# Patient Record
Sex: Female | Born: 1975 | Race: White | Hispanic: No | Marital: Married | Smoking: Former smoker
Health system: Southern US, Community
[De-identification: ages and names within clinical notes are randomized; demographics above are authoritative.]

## PROBLEM LIST (undated history)

## (undated) DIAGNOSIS — O139 Gestational [pregnancy-induced] hypertension without significant proteinuria, unspecified trimester: Secondary | ICD-10-CM

## (undated) DIAGNOSIS — E785 Hyperlipidemia, unspecified: Secondary | ICD-10-CM

## (undated) DIAGNOSIS — G8929 Other chronic pain: Secondary | ICD-10-CM

## (undated) DIAGNOSIS — F418 Other specified anxiety disorders: Secondary | ICD-10-CM

## (undated) DIAGNOSIS — E669 Obesity, unspecified: Secondary | ICD-10-CM

## (undated) DIAGNOSIS — M1990 Unspecified osteoarthritis, unspecified site: Secondary | ICD-10-CM

## (undated) DIAGNOSIS — G4730 Sleep apnea, unspecified: Secondary | ICD-10-CM

## (undated) DIAGNOSIS — F329 Major depressive disorder, single episode, unspecified: Secondary | ICD-10-CM

## (undated) DIAGNOSIS — IMO0002 Reserved for concepts with insufficient information to code with codable children: Secondary | ICD-10-CM

## (undated) DIAGNOSIS — F32A Depression, unspecified: Secondary | ICD-10-CM

## (undated) DIAGNOSIS — O99345 Other mental disorders complicating the puerperium: Secondary | ICD-10-CM

## (undated) DIAGNOSIS — F99 Mental disorder, not otherwise specified: Secondary | ICD-10-CM

## (undated) DIAGNOSIS — B999 Unspecified infectious disease: Secondary | ICD-10-CM

## (undated) DIAGNOSIS — O24419 Gestational diabetes mellitus in pregnancy, unspecified control: Secondary | ICD-10-CM

## (undated) HISTORY — DX: Other mental disorders complicating the puerperium: O99.345

## (undated) HISTORY — PX: OTHER SURGICAL HISTORY: SHX169

## (undated) HISTORY — DX: Other chronic pain: G89.29

## (undated) HISTORY — DX: Unspecified osteoarthritis, unspecified site: M19.90

## (undated) HISTORY — PX: DILATION AND CURETTAGE OF UTERUS: SHX78

## (undated) HISTORY — DX: Hyperlipidemia, unspecified: E78.5

## (undated) HISTORY — DX: Obesity, unspecified: E66.9

## (undated) HISTORY — DX: Other specified anxiety disorders: F41.8

## (undated) SURGERY — Surgical Case
Anesthesia: *Unknown

---

## 2000-10-31 ENCOUNTER — Other Ambulatory Visit: Admission: RE | Admit: 2000-10-31 | Discharge: 2000-10-31 | Payer: Self-pay | Admitting: *Deleted

## 2000-11-02 ENCOUNTER — Other Ambulatory Visit: Admission: RE | Admit: 2000-11-02 | Discharge: 2000-11-02 | Payer: Self-pay | Admitting: *Deleted

## 2001-11-26 ENCOUNTER — Other Ambulatory Visit: Admission: RE | Admit: 2001-11-26 | Discharge: 2001-11-26 | Payer: Self-pay | Admitting: Obstetrics and Gynecology

## 2002-10-06 DIAGNOSIS — IMO0002 Reserved for concepts with insufficient information to code with codable children: Secondary | ICD-10-CM

## 2002-10-06 HISTORY — DX: Reserved for concepts with insufficient information to code with codable children: IMO0002

## 2002-12-12 ENCOUNTER — Other Ambulatory Visit: Admission: RE | Admit: 2002-12-12 | Discharge: 2002-12-12 | Payer: Self-pay | Admitting: *Deleted

## 2002-12-12 ENCOUNTER — Other Ambulatory Visit: Admission: RE | Admit: 2002-12-12 | Discharge: 2002-12-12 | Payer: Self-pay | Admitting: Obstetrics and Gynecology

## 2006-02-16 ENCOUNTER — Inpatient Hospital Stay (HOSPITAL_COMMUNITY): Admission: RE | Admit: 2006-02-16 | Discharge: 2006-02-27 | Payer: Self-pay | Admitting: Psychiatry

## 2006-02-17 ENCOUNTER — Ambulatory Visit: Payer: Self-pay | Admitting: Psychiatry

## 2012-02-06 ENCOUNTER — Other Ambulatory Visit (HOSPITAL_COMMUNITY): Payer: Self-pay | Admitting: Obstetrics

## 2012-02-06 DIAGNOSIS — N979 Female infertility, unspecified: Secondary | ICD-10-CM

## 2012-02-13 ENCOUNTER — Ambulatory Visit (HOSPITAL_COMMUNITY)
Admission: RE | Admit: 2012-02-13 | Discharge: 2012-02-13 | Disposition: A | Discharge status: Home / Self Care | Payer: Managed Care, Other (non HMO) | Source: Ambulatory Visit | Attending: Obstetrics | Admitting: Obstetrics

## 2012-02-13 DIAGNOSIS — N979 Female infertility, unspecified: Secondary | ICD-10-CM | POA: Insufficient documentation

## 2012-02-13 MED ORDER — IOHEXOL 300 MG/ML  SOLN
7.0000 mL | Freq: Once | INTRAMUSCULAR | Status: AC | PRN
  Administered 2012-02-13: 7 mL

## 2012-07-26 ENCOUNTER — Encounter (HOSPITAL_COMMUNITY): Payer: Self-pay | Admitting: *Deleted

## 2012-07-26 ENCOUNTER — Other Ambulatory Visit: Payer: Self-pay | Admitting: Obstetrics

## 2012-07-27 ENCOUNTER — Encounter (HOSPITAL_COMMUNITY): Payer: Self-pay | Admitting: Anesthesiology

## 2012-07-27 ENCOUNTER — Ambulatory Visit (HOSPITAL_COMMUNITY)
Admission: AD | Admit: 2012-07-27 | Discharge: 2012-07-27 | Disposition: A | Discharge status: Home / Self Care | Payer: BC Managed Care – PPO | Source: Ambulatory Visit | Attending: Obstetrics | Admitting: Obstetrics

## 2012-07-27 ENCOUNTER — Encounter (HOSPITAL_COMMUNITY): Payer: Self-pay | Admitting: *Deleted

## 2012-07-27 ENCOUNTER — Encounter (HOSPITAL_COMMUNITY): Payer: Self-pay | Admitting: Pharmacist

## 2012-07-27 ENCOUNTER — Ambulatory Visit (HOSPITAL_COMMUNITY): Payer: BC Managed Care – PPO | Admitting: Anesthesiology

## 2012-07-27 ENCOUNTER — Ambulatory Visit (HOSPITAL_COMMUNITY): Payer: BC Managed Care – PPO

## 2012-07-27 ENCOUNTER — Encounter (HOSPITAL_COMMUNITY)
Admission: AD | Disposition: A | Discharge status: Home / Self Care | Payer: Self-pay | Source: Ambulatory Visit | Attending: Obstetrics

## 2012-07-27 DIAGNOSIS — O021 Missed abortion: Secondary | ICD-10-CM

## 2012-07-27 HISTORY — DX: Mental disorder, not otherwise specified: F99

## 2012-07-27 HISTORY — PX: DILATION AND EVACUATION: SHX1459

## 2012-07-27 HISTORY — DX: Sleep apnea, unspecified: G47.30

## 2012-07-27 LAB — CBC
MCH: 31.4 pg (ref 26.0–34.0)
MCHC: 34.3 g/dL (ref 30.0–36.0)
Platelets: 268 10*3/uL (ref 150–400)

## 2012-07-27 SURGERY — DILATION AND EVACUATION, UTERUS
Anesthesia: General | Site: Uterus | Wound class: Clean Contaminated

## 2012-07-27 MED ORDER — FENTANYL CITRATE 0.05 MG/ML IJ SOLN
INTRAMUSCULAR | Status: AC
  Filled 2012-07-27: qty 2

## 2012-07-27 MED ORDER — LIDOCAINE HCL (CARDIAC) 20 MG/ML IV SOLN
INTRAVENOUS | Status: AC
  Filled 2012-07-27: qty 5

## 2012-07-27 MED ORDER — LIDOCAINE HCL 1 % IJ SOLN
INTRAMUSCULAR | Status: DC | PRN
  Administered 2012-07-27: 20 mL

## 2012-07-27 MED ORDER — DOXYCYCLINE HYCLATE 100 MG IV SOLR
100.0000 mg | INTRAVENOUS | Status: AC
  Administered 2012-07-27: 100 mg via INTRAVENOUS
  Filled 2012-07-27: qty 100

## 2012-07-27 MED ORDER — PROPOFOL 10 MG/ML IV EMUL
INTRAVENOUS | Status: DC | PRN
  Administered 2012-07-27: 200 mg via INTRAVENOUS

## 2012-07-27 MED ORDER — DEXAMETHASONE SODIUM PHOSPHATE 10 MG/ML IJ SOLN
INTRAMUSCULAR | Status: AC
  Filled 2012-07-27: qty 1

## 2012-07-27 MED ORDER — ONDANSETRON HCL 4 MG/2ML IJ SOLN
INTRAMUSCULAR | Status: DC | PRN
  Administered 2012-07-27: 4 mg via INTRAVENOUS

## 2012-07-27 MED ORDER — FENTANYL CITRATE 0.05 MG/ML IJ SOLN: 25.0000 ug | INTRAMUSCULAR | Status: DC | PRN

## 2012-07-27 MED ORDER — LIDOCAINE HCL (CARDIAC) 20 MG/ML IV SOLN
INTRAVENOUS | Status: DC | PRN
  Administered 2012-07-27 (×2): 50 mg via INTRAVENOUS

## 2012-07-27 MED ORDER — SILVER NITRATE-POT NITRATE 75-25 % EX MISC
CUTANEOUS | Status: DC | PRN
  Administered 2012-07-27: 2

## 2012-07-27 MED ORDER — KETOROLAC TROMETHAMINE 30 MG/ML IJ SOLN
INTRAMUSCULAR | Status: AC
  Filled 2012-07-27: qty 1

## 2012-07-27 MED ORDER — DEXAMETHASONE SODIUM PHOSPHATE 4 MG/ML IJ SOLN
INTRAMUSCULAR | Status: DC | PRN
  Administered 2012-07-27: 10 mg via INTRAVENOUS

## 2012-07-27 MED ORDER — ONDANSETRON HCL 4 MG/2ML IJ SOLN
INTRAMUSCULAR | Status: AC
  Filled 2012-07-27: qty 2

## 2012-07-27 MED ORDER — LACTATED RINGERS IV SOLN
INTRAVENOUS | Status: DC
  Administered 2012-07-27 (×2): via INTRAVENOUS

## 2012-07-27 MED ORDER — PROPOFOL 10 MG/ML IV EMUL
INTRAVENOUS | Status: AC
  Filled 2012-07-27: qty 20

## 2012-07-27 MED ORDER — MIDAZOLAM HCL 2 MG/2ML IJ SOLN
INTRAMUSCULAR | Status: AC
  Filled 2012-07-27: qty 2

## 2012-07-27 MED ORDER — SILVER NITRATE-POT NITRATE 75-25 % EX MISC
CUTANEOUS | Status: AC
  Filled 2012-07-27: qty 2

## 2012-07-27 MED ORDER — FENTANYL CITRATE 0.05 MG/ML IJ SOLN
INTRAMUSCULAR | Status: DC | PRN
  Administered 2012-07-27: 100 ug via INTRAVENOUS

## 2012-07-27 MED ORDER — MIDAZOLAM HCL 5 MG/5ML IJ SOLN
INTRAMUSCULAR | Status: DC | PRN
  Administered 2012-07-27: 2 mg via INTRAVENOUS

## 2012-07-27 SURGICAL SUPPLY — 18 items
CATH ROBINSON RED A/P 16FR (CATHETERS) ×2 IMPLANT
CLOTH BEACON ORANGE TIMEOUT ST (SAFETY) ×2 IMPLANT
DECANTER SPIKE VIAL GLASS SM (MISCELLANEOUS) ×2 IMPLANT
GLOVE BIO SURGEON STRL SZ 6.5 (GLOVE) ×2 IMPLANT
GLOVE BIOGEL PI IND STRL 7.0 (GLOVE) ×2 IMPLANT
GLOVE BIOGEL PI INDICATOR 7.0 (GLOVE) ×2
KIT BERKELEY 1ST TRIMESTER 3/8 (MISCELLANEOUS) ×2 IMPLANT
NDL SPNL 22GX3.5 QUINCKE BK (NEEDLE) ×1 IMPLANT
NEEDLE SPNL 22GX3.5 QUINCKE BK (NEEDLE) ×2 IMPLANT
NS IRRIG 1000ML POUR BTL (IV SOLUTION) ×2 IMPLANT
PACK VAGINAL MINOR WOMEN LF (CUSTOM PROCEDURE TRAY) ×2 IMPLANT
PAD OB MATERNITY 4.3X12.25 (PERSONAL CARE ITEMS) ×2 IMPLANT
PAD PREP 24X48 CUFFED NSTRL (MISCELLANEOUS) ×2 IMPLANT
SET BERKELEY SUCTION TUBING (SUCTIONS) ×2 IMPLANT
SYR CONTROL 10ML LL (SYRINGE) ×2 IMPLANT
TOWEL OR 17X24 6PK STRL BLUE (TOWEL DISPOSABLE) ×4 IMPLANT
VACURETTE 14MM CVD 1/2 BASE (CANNULA) ×2 IMPLANT
VACURETTE 7MM CVD STRL WRAP (CANNULA) ×1 IMPLANT

## 2012-07-27 NOTE — Anesthesia Procedure Notes (Signed)
Procedure Name: LMA Insertion Date/Time: 07/27/2012 1:39 PM Performed by: Isabella Bowens R Pre-anesthesia Checklist: Patient identified, Emergency Drugs available, Suction available, Timeout performed and Patient being monitored Patient Re-evaluated:Patient Re-evaluated prior to inductionOxygen Delivery Method: Circle system utilized Preoxygenation: Pre-oxygenation with 100% oxygen Intubation Type: IV induction Ventilation: Mask ventilation without difficulty LMA: LMA inserted LMA Size: 4.0 Grade View: Grade II Number of attempts: 1 Placement Confirmation: positive ETCO2 and breath sounds checked- equal and bilateral Dental Injury: Teeth and Oropharynx as per pre-operative assessment

## 2012-07-27 NOTE — Anesthesia Postprocedure Evaluation (Signed)
  Anesthesia Post-op Note  Patient: Pamela Marsh  Procedure(s) Performed: Procedure(s) (LRB) with comments: DILATATION AND EVACUATION (N/A) - with ultrasound  Patient Location: PACU  Anesthesia Type: General  Level of Consciousness: awake, alert  and oriented  Airway and Oxygen Therapy: Patient Spontanous Breathing  Post-op Pain: none  Post-op Assessment: Post-op Vital signs reviewed, Patient's Cardiovascular Status Stable, Respiratory Function Stable, Patent Airway, No signs of Nausea or vomiting and Pain level controlled  Post-op Vital Signs: Reviewed and stable  Complications: No apparent anesthesia complications

## 2012-07-27 NOTE — Progress Notes (Signed)
Phone call to Dr Elpidio Eric office by Fran Lowes, RN to discuss Rh status and whether rhogam had been given previously.  Patient's blood type B neg and was given rhogam in office on 07/21/2012 per Fran Lowes, RN.

## 2012-07-27 NOTE — Anesthesia Preprocedure Evaluation (Signed)
Anesthesia Evaluation  Patient identified by MRN, date of birth, ID band Patient awake    Reviewed: Allergy & Precautions, H&P , Patient's Chart, lab work & pertinent test results, reviewed documented beta blocker date and time   Airway Mallampati: II TM Distance: >3 FB Neck ROM: full    Dental No notable dental hx.    Pulmonary sleep apnea ,  breath sounds clear to auscultation  Pulmonary exam normal       Cardiovascular Rhythm:regular Rate:Normal     Neuro/Psych    GI/Hepatic   Endo/Other    Renal/GU      Musculoskeletal   Abdominal   Peds  Hematology   Anesthesia Other Findings Sl large neck, aperture adequate  Reproductive/Obstetrics                           Anesthesia Physical Anesthesia Plan  ASA: II  Anesthesia Plan: General   Post-op Pain Management:    Induction: Intravenous  Airway Management Planned: LMA  Additional Equipment:   Intra-op Plan:   Post-operative Plan:   Informed Consent: I have reviewed the patients History and Physical, chart, labs and discussed the procedure including the risks, benefits and alternatives for the proposed anesthesia with the patient or authorized representative who has indicated his/her understanding and acceptance.   Dental Advisory Given  Plan Discussed with: CRNA and Surgeon  Anesthesia Plan Comments: (  Discussed  general anesthesia, including possible nausea, instrumentation of airway, sore throat,pulmonary aspiration, etc. I asked if the were any outstanding questions, or  concerns before we proceeded. )        Anesthesia Quick Evaluation

## 2012-07-27 NOTE — Op Note (Signed)
07/27/2012  2:12 PM  PATIENT:  Pamela Marsh  36 y.o. female  PRE-OPERATIVE DIAGNOSIS:  Missed Abortion at 6 weeks  POST-OPERATIVE DIAGNOSIS:  Missed Abortion  PROCEDURE:  Procedure(s) (LRB) with comments: DILATATION AND EVACUATION (N/A) - with ultrasound  SURGEON:  Surgeon(s) and Role:    * Jodye Scali A. Ernestina Penna, MD - Primary  PHYSICIAN ASSISTANT:   ASSISTANTS: none   ANESTHESIA:   local and IV sedation  EBL:  Total I/O In: 1300 [I.V.:1300] Out: 5 [Blood:5]  BLOOD ADMINISTERED:none  DRAINS: none   LOCAL MEDICATIONS USED:  LIDOCAINE , 20 cc 1% plain lidocaine  SPECIMEN:  Source of Specimen:  Products of conception  DISPOSITION OF SPECIMEN:  PATHOLOGY  COUNTS:  YES  TOURNIQUET:  * No tourniquets in log *  DICTATION: .Note written in paper chart and Note written in EPIC  PLAN OF CARE: Discharge to home after PACU  PATIENT DISPOSITION:  PACU - hemodynamically stable.   Delay start of Pharmacological VTE agent (>24hrs) due to surgical blood loss or risk of bleeding: yes  Complications: None Antibiotics: 100 mg IV doxycycline Findings: 3 Cytotec in the vagina, small uterus, normal appearing failed intrauterine pregnancy by ultrasound preprocedure, thickened endometrial stripe with no evidence of retained products of conception post procedure, good hemostasis  Indications: This is a 36 year old G1 with missed abortion at 7 weeks. Patient had been given option of expectant management, Cytotec, in the office IPAS procedure or in operating room D&C. Patient decided on the latter after hearing risks benefits of all procedures.  Procedure: After informed to was obtained the patient was taken to the operating room where IV sedation was given. She was prepped and draped in the normal sterile fashion in the dorsal supine lithotomy position. A bimanual examination was done to assess the size and position of the uterus. A speculum was inserted into the vagina the vagina had been  prior prepped with Betadine. A single-tooth tenaculum was used to grasp the anterior lip of the cervix. The cervix was sounded to 7 cm. Ultrasound guidance was used during all uterine procedure. The cervix was serially dilated to a #21 Shawnie Pons. This was done easily. The 7 French suction curet was inserted through the internal os placed on suction. Removal of moderate amounts of products of conception was noted. A second pass was done. Ultrasound guidance again used. The endometrial stripe is thin with no blood flow or evidence of retained products of conception. There was no bleeding.  The tenaculum was removed. Silver nitrate was used to create hemostasis of the tenaculum site. All sponge lap, needle, instrument counts were correct. Patient was awoken and taken to the recovery room in a stable condition  Spike Desilets A. 07/27/2012 2:16 PM

## 2012-07-27 NOTE — Brief Op Note (Signed)
07/27/2012  2:12 PM  PATIENT:  Pamela Marsh  36 y.o. female  PRE-OPERATIVE DIAGNOSIS:  Missed Abortion at 6 weeks  POST-OPERATIVE DIAGNOSIS:  Missed Abortion  PROCEDURE:  Procedure(s) (LRB) with comments: DILATATION AND EVACUATION (N/A) - with ultrasound  SURGEON:  Surgeon(s) and Role:    * Jhana Giarratano A. Ernestina Penna, MD - Primary  PHYSICIAN ASSISTANT:   ASSISTANTS: none   ANESTHESIA:   local and IV sedation  EBL:  Total I/O In: 1300 [I.V.:1300] Out: 5 [Blood:5]  BLOOD ADMINISTERED:none  DRAINS: none   LOCAL MEDICATIONS USED:  LIDOCAINE , 20 cc 1% plain lidocaine  SPECIMEN:  Source of Specimen:  Products of conception  DISPOSITION OF SPECIMEN:  PATHOLOGY  COUNTS:  YES  TOURNIQUET:  * No tourniquets in log *  DICTATION: .Note written in paper chart and Note written in EPIC  PLAN OF CARE: Discharge to home after PACU  PATIENT DISPOSITION:  PACU - hemodynamically stable.   Delay start of Pharmacological VTE agent (>24hrs) due to surgical blood loss or risk of bleeding: yes

## 2012-07-27 NOTE — Transfer of Care (Signed)
Immediate Anesthesia Transfer of Care Note  Patient: Pamela Marsh  Procedure(s) Performed: Procedure(s) (LRB) with comments: DILATATION AND EVACUATION (N/A) - with ultrasound  Patient Location: PACU  Anesthesia Type: General  Level of Consciousness: awake, alert  and oriented  Airway & Oxygen Therapy: Patient Spontanous Breathing and Patient connected to nasal cannula oxygen  Post-op Assessment: Report given to PACU RN and Post -op Vital signs reviewed and stable  Post vital signs: Reviewed and stable  Complications: No apparent anesthesia complications

## 2012-07-27 NOTE — H&P (Signed)
See scanned H&P for full details  In short 36 uo G1 at 7 wks w/ missed Ab, planning surgical management. 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 H/o infertility.  PMH: obesity, bipolar  CBC    Component Value Date/Time   WBC 11.5* 07/27/2012 1201   RBC 4.68 07/27/2012 1201   HGB 14.7 07/27/2012 1201   HCT 42.8 07/27/2012 1201   PLT 268 07/27/2012 1201   MCV 91.5 07/27/2012 1201   MCH 31.4 07/27/2012 1201   MCHC 34.3 07/27/2012 1201   RDW 12.6 07/27/2012 1201     A/P: u/s guided D&C. S/p Rhogam, R/B reviewed w/ pt. Doxy IV - aware of risks w/ possible uterine abnormality.  Pamela Marsh A. 07/27/2012 1:16 PM

## 2012-07-28 ENCOUNTER — Encounter (HOSPITAL_COMMUNITY): Payer: Self-pay | Admitting: Obstetrics

## 2013-09-06 LAB — OB RESULTS CONSOLE ABO/RH: RH TYPE: NEGATIVE

## 2013-09-06 LAB — OB RESULTS CONSOLE HIV ANTIBODY (ROUTINE TESTING): HIV: NONREACTIVE

## 2013-09-06 LAB — OB RESULTS CONSOLE RPR: RPR: NONREACTIVE

## 2013-09-06 LAB — OB RESULTS CONSOLE RUBELLA ANTIBODY, IGM: Rubella: IMMUNE

## 2013-09-06 LAB — OB RESULTS CONSOLE HEPATITIS B SURFACE ANTIGEN: Hepatitis B Surface Ag: NEGATIVE

## 2013-09-08 LAB — OB RESULTS CONSOLE GC/CHLAMYDIA
Chlamydia: NEGATIVE
Gonorrhea: NEGATIVE

## 2013-10-06 NOTE — L&D Delivery Note (Signed)
Delivery Note At 3:44 AM a viable female was delivered via  (Presentation: ROP;  ).  APGAR: 3, 8; weight 7 lb 1.9 oz (3230 g).   Placenta status: retained- manual extraction in LDR, after increased meds through epidural and sublingual nitrous oxide , .  Cord: 3 vessels .  Cord pH: 7.1  Over the last 10-15 minutes of pushing, minimal variability and decels with pushing with recovery to baseline between pushes. Meconium noted and given these risk factors, neonatology called for delivery. Baby delivered easily w/ maternal Valsalva with emesis. Head and shoulders delivered with single expulsive effort. Baby meconium stained, without spontaneous cry and poor tone. Cord clamped and cut quickly and baby to neonatologist. 2nd degree laceration repaired w/ 2-0 vicryl. After 30 minutes placenta did not delivery, consent obtained and manual extraction attempted. Unable to remove placenta and cord avulsed. Plan made to proceed to OR for second attempt with possible D&C. Anaesthesia consult obtained and decided to give SL nitrous in LDR. Pt more relaxed, cervix now able to accomodate examining hand and placenta removed in 2 pieces. Uterus explored manually, no evidence retained placenta, no bleeding. Hypotension from nitrous recovered. Pt stable   Anesthesia: Epidural  Episiotomy: none Lacerations: 2nd  Suture Repair: 3.0 vicryl rapide Est. Blood Loss (mL): 450  Mom to postpartum.  Baby to NICU.  Charmelle Soh A. 03/17/2014, 5:08 AM

## 2013-12-02 LAB — OB RESULTS CONSOLE ANTIBODY SCREEN: ANTIBODY SCREEN: NEGATIVE

## 2014-01-11 ENCOUNTER — Encounter: Payer: BC Managed Care – PPO | Attending: Obstetrics

## 2014-01-11 VITALS — Ht 68.0 in | Wt 244.8 lb

## 2014-01-11 DIAGNOSIS — Z713 Dietary counseling and surveillance: Secondary | ICD-10-CM | POA: Insufficient documentation

## 2014-01-11 DIAGNOSIS — O99810 Abnormal glucose complicating pregnancy: Secondary | ICD-10-CM | POA: Insufficient documentation

## 2014-01-12 NOTE — Progress Notes (Signed)
  Patient was seen on 01/11/14 for Gestational Diabetes self-management class at the [facility]. The following learning objectives were met by the patient during this course:   States the definition of Gestational Diabetes  States why dietary management is important in controlling blood glucose  Describes the effects of carbohydrates on blood glucose levels  Demonstrates ability to create a balanced meal plan  Demonstrates carbohydrate counting   States when to check blood glucose levels  Demonstrates proper blood glucose monitoring techniques  States the effect of stress and exercise on blood glucose levels  States the importance of limiting caffeine and abstaining from alcohol and smoking  Plan:  Aim for 2 Carb Choices per meal (30 grams) +/- 1 either way for breakfast Aim for 3 Carb Choices per meal (45 grams) +/- 1 either way from lunch and dinner Aim for 1-2 Carbs per snack Begin reading food labels for Total Carbohydrate and sugar grams of foods Consider  increasing your activity level by walking daily as tolerated Begin checking BG before breakfast and 1-2 hours after first bit of breakfast, lunch and dinner after  as directed by MD  Take medication  as directed by MD  Blood glucose monitor given: One Touch Ultra Mini Lot # [id number] X Exp: 05/2014 Blood glucose reading: 109  Patient instructed to monitor glucose levels: FBS: 60 - <90 2 hour: <120  Patient received the following handouts:  Nutrition Diabetes and Pregnancy  Carbohydrate Counting List  Meal Planning worksheet  Patient will be seen for follow-up as needed.

## 2014-02-28 ENCOUNTER — Inpatient Hospital Stay (HOSPITAL_COMMUNITY): Admission: AD | Admit: 2014-02-28 | Payer: BC Managed Care – PPO | Source: Ambulatory Visit | Admitting: Obstetrics

## 2014-03-01 LAB — OB RESULTS CONSOLE GBS: GBS: POSITIVE

## 2014-03-11 ENCOUNTER — Encounter (HOSPITAL_COMMUNITY): Payer: Self-pay | Admitting: *Deleted

## 2014-03-11 ENCOUNTER — Inpatient Hospital Stay (HOSPITAL_COMMUNITY)
Admission: AD | Admit: 2014-03-11 | Discharge: 2014-03-11 | Disposition: A | Discharge status: Home / Self Care | Payer: 59 | Source: Ambulatory Visit | Attending: Obstetrics | Admitting: Obstetrics

## 2014-03-11 ENCOUNTER — Inpatient Hospital Stay (HOSPITAL_COMMUNITY): Payer: 59

## 2014-03-11 DIAGNOSIS — O409XX0 Polyhydramnios, unspecified trimester, not applicable or unspecified: Secondary | ICD-10-CM | POA: Insufficient documentation

## 2014-03-11 DIAGNOSIS — O10019 Pre-existing essential hypertension complicating pregnancy, unspecified trimester: Secondary | ICD-10-CM | POA: Insufficient documentation

## 2014-03-11 DIAGNOSIS — Z87891 Personal history of nicotine dependence: Secondary | ICD-10-CM | POA: Insufficient documentation

## 2014-03-11 DIAGNOSIS — G4730 Sleep apnea, unspecified: Secondary | ICD-10-CM | POA: Insufficient documentation

## 2014-03-11 DIAGNOSIS — O99810 Abnormal glucose complicating pregnancy: Secondary | ICD-10-CM | POA: Insufficient documentation

## 2014-03-11 HISTORY — DX: Unspecified infectious disease: B99.9

## 2014-03-11 HISTORY — DX: Gestational diabetes mellitus in pregnancy, unspecified control: O24.419

## 2014-03-11 HISTORY — DX: Depression, unspecified: F32.A

## 2014-03-11 HISTORY — DX: Major depressive disorder, single episode, unspecified: F32.9

## 2014-03-11 HISTORY — DX: Gestational (pregnancy-induced) hypertension without significant proteinuria, unspecified trimester: O13.9

## 2014-03-11 LAB — URINALYSIS, ROUTINE W REFLEX MICROSCOPIC
Bilirubin Urine: NEGATIVE
GLUCOSE, UA: NEGATIVE mg/dL
HGB URINE DIPSTICK: NEGATIVE
Ketones, ur: NEGATIVE mg/dL
LEUKOCYTES UA: NEGATIVE
Nitrite: NEGATIVE
PH: 6.5 (ref 5.0–8.0)
PROTEIN: NEGATIVE mg/dL
SPECIFIC GRAVITY, URINE: 1.01 (ref 1.005–1.030)
Urobilinogen, UA: 0.2 mg/dL (ref 0.0–1.0)

## 2014-03-11 LAB — CBC
HCT: 39.4 % (ref 36.0–46.0)
Hemoglobin: 13.7 g/dL (ref 12.0–15.0)
MCH: 32 pg (ref 26.0–34.0)
MCHC: 34.8 g/dL (ref 30.0–36.0)
MCV: 92.1 fL (ref 78.0–100.0)
Platelets: 195 10*3/uL (ref 150–400)
RBC: 4.28 MIL/uL (ref 3.87–5.11)
RDW: 13.4 % (ref 11.5–15.5)
WBC: 15.8 10*3/uL — ABNORMAL HIGH (ref 4.0–10.5)

## 2014-03-11 LAB — COMPREHENSIVE METABOLIC PANEL
ALT: 28 U/L (ref 0–35)
AST: 33 U/L (ref 0–37)
Albumin: 2.8 g/dL — ABNORMAL LOW (ref 3.5–5.2)
Alkaline Phosphatase: 94 U/L (ref 39–117)
BUN: 16 mg/dL (ref 6–23)
CO2: 22 mEq/L (ref 19–32)
Calcium: 10 mg/dL (ref 8.4–10.5)
Chloride: 102 mEq/L (ref 96–112)
Creatinine, Ser: 0.64 mg/dL (ref 0.50–1.10)
GFR calc Af Amer: >90 mL/min (ref >90)
GFR calc non Af Amer: >90 mL/min (ref >90)
Glucose, Bld: 113 mg/dL — ABNORMAL HIGH (ref 70–99)
Potassium: 4.1 mEq/L (ref 3.7–5.3)
Sodium: 137 mEq/L (ref 137–147)
Total Bilirubin: <0.2 mg/dL — ABNORMAL LOW (ref 0.3–1.2)
Total Protein: 6.2 g/dL (ref 6.0–8.3)

## 2014-03-11 LAB — URIC ACID: Uric Acid, Serum: 5.6 mg/dL (ref 2.4–7.0)

## 2014-03-11 NOTE — MAU Note (Signed)
Has been seeing spots, and has a headache- started at noon.no help with Tylenol.  Just doesn't feel good.  BP elevated 150's/90-100's.. Pt is on labetalol.

## 2014-03-11 NOTE — MAU Provider Note (Signed)
History     CSN: [id number]  Arrival date and time: 03/11/14 1625 Provider here to evaluate patient @ 1645   Chief Complaint  Patient presents with  . Headache  . Hypertension  . Spots and/or Floaters   HPI GDMa2 and gestational hypertension Reports skipped breakfast and late taking BP meds this AM  Dizzy and lightheaded with headache and "spots" in vision Pressure on bladder all day + nausea all day / no vomiting / + diarrhea x 2 No epigastric pain or back pain Glucose 96 1 hour after protein bar and tea then cheeseburger and pasta for lunch No labor signs / active FM (+) Prenatal review BP range 140-160/ 90s - Labetalol 100 TID Labs done 6/4: uric acid 5.4 / creatinine 0.55 / SGOT 42 / SGPT 32 / hgb 13.2 / hct 38.6 / plt 195 SONO: vtx / EFW 5-15 at 93% ( AC 98%) / AFI 15.6 / grade 2 placenta  Past Medical History  Diagnosis Date  . Mental disorder     hx - no meds  . Sleep apnea     no cpap - lost 25 lbs since study   Past Surgical History  Procedure Laterality Date  . Right knee surgery      acl  . Dilation and evacuation  07/27/2012    Procedure: DILATATION AND EVACUATION;  Surgeon: Alphonsus SiasKelly A. Ernestina PennaFogleman, MD;  Location: [facility];  Service: Gynecology;  Laterality: N/A;  with ultrasound   Family History  Problem Relation Age of Onset  . Hypertension Father   . Hyperlipidemia Father   . Diabetes Father   . Obesity Father   . Sleep apnea Father    History  Substance Use Topics  . Smoking status: Former Smoker -- 0.50 packs/day for 4 years    Quit date: 01/26/2000  . Smokeless tobacco: Never Used  . Alcohol Use: Yes     Comment: socially   Allergies: No Known Allergies  Current Medications: Prenatal Vitamin Daily Glyburide 2.5 mg daily at HS Labetalol 100 mg TID  ROS Physical Exam   Blood pressure 143/92, pulse 98, temperature 98.3 F (36.8 C), temperature source Oral, resp. rate 20, height 5' 6.5" (1.689 m), weight 117.028 kg (258 lb).  Blood pressures:  143/92 - 140/91 -   Physical Exam Alert and oriented - NAD or pain Abdomen: gravid soft and non-tender Extremities 2+ dependent edema / DTR 2+  Defer pelvic exam  Labs: CBC: hgb 13.7 / hct 39.7 / plt 195           CMP: SGOT 33 / SGPT 28 / glucose 118 / creat 0.6           Uric acid: 5.6           Urinalysis:  FHR baseline 140 / moderate variability Toco - no ctx  MAU Course  Procedures  NST: 140 baseline - accels to 155                      BPP 8-8 with AFI 27  Assessment and Plan  37 weeks with headache GDMa2 on glyburide daily - no evidence of hypoglycemia Chronic hypertension with increasing hypertension on Labetalol 100 mg TID No evidence of PEC polyhydramnios  1) NST- reactive 2) Evaluate for PEC: PIH labs and serial BP - labs normal 3) DC home  Instructions - adherence to low carb and LOW sodium diet / increase water hydration  take medications on schedule                        small frequent meals every 4 hours while awake - protein snack at HS                        rest at home remainder of weekend   Marlinda Mike CNM, MSN, [facility] 03/11/2014, 4:45 PM

## 2014-03-11 NOTE — MAU Note (Signed)
This preg started as a twin gestation

## 2014-03-15 ENCOUNTER — Inpatient Hospital Stay (HOSPITAL_COMMUNITY)
Admission: AD | Admit: 2014-03-15 | Discharge: 2014-03-19 | DRG: 774 | Disposition: A | Discharge status: Home / Self Care | Payer: 59 | Source: Ambulatory Visit | Attending: Obstetrics | Admitting: Obstetrics

## 2014-03-15 ENCOUNTER — Encounter (HOSPITAL_COMMUNITY): Payer: Self-pay | Admitting: *Deleted

## 2014-03-15 DIAGNOSIS — I10 Essential (primary) hypertension: Secondary | ICD-10-CM | POA: Diagnosis present

## 2014-03-15 DIAGNOSIS — Z87891 Personal history of nicotine dependence: Secondary | ICD-10-CM

## 2014-03-15 DIAGNOSIS — Z8249 Family history of ischemic heart disease and other diseases of the circulatory system: Secondary | ICD-10-CM

## 2014-03-15 DIAGNOSIS — O99214 Obesity complicating childbirth: Secondary | ICD-10-CM

## 2014-03-15 DIAGNOSIS — Z22330 Carrier of Group B streptococcus: Secondary | ICD-10-CM

## 2014-03-15 DIAGNOSIS — O9989 Other specified diseases and conditions complicating pregnancy, childbirth and the puerperium: Secondary | ICD-10-CM

## 2014-03-15 DIAGNOSIS — O360990 Maternal care for other rhesus isoimmunization, unspecified trimester, not applicable or unspecified: Secondary | ICD-10-CM | POA: Diagnosis present

## 2014-03-15 DIAGNOSIS — O09529 Supervision of elderly multigravida, unspecified trimester: Secondary | ICD-10-CM | POA: Diagnosis present

## 2014-03-15 DIAGNOSIS — Q51810 Arcuate uterus: Secondary | ICD-10-CM

## 2014-03-15 DIAGNOSIS — O99814 Abnormal glucose complicating childbirth: Secondary | ICD-10-CM | POA: Diagnosis present

## 2014-03-15 DIAGNOSIS — O1490 Unspecified pre-eclampsia, unspecified trimester: Secondary | ICD-10-CM | POA: Diagnosis present

## 2014-03-15 DIAGNOSIS — O99892 Other specified diseases and conditions complicating childbirth: Secondary | ICD-10-CM | POA: Diagnosis present

## 2014-03-15 DIAGNOSIS — E669 Obesity, unspecified: Secondary | ICD-10-CM | POA: Diagnosis present

## 2014-03-15 DIAGNOSIS — IMO0002 Reserved for concepts with insufficient information to code with codable children: Principal | ICD-10-CM | POA: Diagnosis present

## 2014-03-15 DIAGNOSIS — O3660X0 Maternal care for excessive fetal growth, unspecified trimester, not applicable or unspecified: Secondary | ICD-10-CM | POA: Diagnosis present

## 2014-03-15 DIAGNOSIS — O3400 Maternal care for unspecified congenital malformation of uterus, unspecified trimester: Secondary | ICD-10-CM | POA: Diagnosis present

## 2014-03-15 DIAGNOSIS — O409XX0 Polyhydramnios, unspecified trimester, not applicable or unspecified: Secondary | ICD-10-CM | POA: Diagnosis present

## 2014-03-15 DIAGNOSIS — Z833 Family history of diabetes mellitus: Secondary | ICD-10-CM

## 2014-03-15 DIAGNOSIS — Z6839 Body mass index (BMI) 39.0-39.9, adult: Secondary | ICD-10-CM

## 2014-03-15 HISTORY — DX: Reserved for concepts with insufficient information to code with codable children: IMO0002

## 2014-03-15 LAB — COMPREHENSIVE METABOLIC PANEL
ALT: 25 U/L (ref 0–35)
AST: 28 U/L (ref 0–37)
Albumin: 2.7 g/dL — ABNORMAL LOW (ref 3.5–5.2)
Alkaline Phosphatase: 97 U/L (ref 39–117)
BUN: 19 mg/dL (ref 6–23)
CALCIUM: 10.3 mg/dL (ref 8.4–10.5)
CO2: 21 mEq/L (ref 19–32)
Chloride: 101 mEq/L (ref 96–112)
Creatinine, Ser: 0.65 mg/dL (ref 0.50–1.10)
GFR calc Af Amer: >90 mL/min (ref >90)
GFR calc non Af Amer: >90 mL/min (ref >90)
GLUCOSE: 126 mg/dL — AB (ref 70–99)
Potassium: 3.8 mEq/L (ref 3.7–5.3)
SODIUM: 135 meq/L — AB (ref 137–147)
TOTAL PROTEIN: 6.3 g/dL (ref 6.0–8.3)
Total Bilirubin: <0.2 mg/dL — ABNORMAL LOW (ref 0.3–1.2)

## 2014-03-15 LAB — CBC
HEMATOCRIT: 39.2 % (ref 36.0–46.0)
HEMOGLOBIN: 13.4 g/dL (ref 12.0–15.0)
MCH: 31.5 pg (ref 26.0–34.0)
MCHC: 34.2 g/dL (ref 30.0–36.0)
MCV: 92 fL (ref 78.0–100.0)
Platelets: 214 10*3/uL (ref 150–400)
RBC: 4.26 MIL/uL (ref 3.87–5.11)
RDW: 13.5 % (ref 11.5–15.5)
WBC: 16.5 10*3/uL — ABNORMAL HIGH (ref 4.0–10.5)

## 2014-03-15 LAB — URIC ACID: URIC ACID, SERUM: 6.6 mg/dL (ref 2.4–7.0)

## 2014-03-15 MED ORDER — OXYTOCIN BOLUS FROM INFUSION
500.0000 mL | INTRAVENOUS | Status: DC
  Administered 2014-03-17 (×2): 500 mL via INTRAVENOUS

## 2014-03-15 MED ORDER — TERBUTALINE SULFATE 1 MG/ML IJ SOLN: 0.2500 mg | Freq: Once | INTRAMUSCULAR | Status: AC | PRN

## 2014-03-15 MED ORDER — LABETALOL HCL 100 MG PO TABS
100.0000 mg | ORAL_TABLET | Freq: Three times a day (TID) | ORAL | Status: DC
  Administered 2014-03-15 – 2014-03-16 (×4): 100 mg via ORAL
  Filled 2014-03-15 (×6): qty 1

## 2014-03-15 MED ORDER — LACTATED RINGERS IV SOLN
500.0000 mL | INTRAVENOUS | Status: DC | PRN
  Administered 2014-03-17: 1000 mL via INTRAVENOUS

## 2014-03-15 MED ORDER — OXYTOCIN 40 UNITS IN LACTATED RINGERS INFUSION - SIMPLE MED
1.0000 m[IU]/min | INTRAVENOUS | Status: DC
  Administered 2014-03-16: 2 m[IU]/min via INTRAVENOUS

## 2014-03-15 MED ORDER — LACTATED RINGERS IV SOLN
INTRAVENOUS | Status: DC
  Administered 2014-03-15: 21:00:00 via INTRAVENOUS
  Administered 2014-03-16: 125 mL/h via INTRAVENOUS

## 2014-03-15 MED ORDER — OXYTOCIN 40 UNITS IN LACTATED RINGERS INFUSION - SIMPLE MED
62.5000 mL/h | INTRAVENOUS | Status: DC
  Administered 2014-03-17: 62.5 mL/h via INTRAVENOUS
  Filled 2014-03-15 (×2): qty 1000

## 2014-03-15 MED ORDER — PENICILLIN G POTASSIUM 5000000 UNITS IJ SOLR
5.0000 10*6.[IU] | Freq: Once | INTRAVENOUS | Status: AC
  Administered 2014-03-16: 5 10*6.[IU] via INTRAVENOUS
  Filled 2014-03-15: qty 5

## 2014-03-15 MED ORDER — OXYCODONE-ACETAMINOPHEN 5-325 MG PO TABS: 1.0000 | ORAL_TABLET | ORAL | Status: DC | PRN

## 2014-03-15 MED ORDER — IBUPROFEN 600 MG PO TABS: 600.0000 mg | ORAL_TABLET | Freq: Four times a day (QID) | ORAL | Status: DC | PRN

## 2014-03-15 MED ORDER — ACETAMINOPHEN 325 MG PO TABS
650.0000 mg | ORAL_TABLET | ORAL | Status: DC | PRN
  Administered 2014-03-16 – 2014-03-17 (×2): 650 mg via ORAL
  Filled 2014-03-15 (×2): qty 2

## 2014-03-15 MED ORDER — MISOPROSTOL 25 MCG QUARTER TABLET
25.0000 ug | ORAL_TABLET | ORAL | Status: DC | PRN
  Administered 2014-03-15 – 2014-03-16 (×2): 25 ug via VAGINAL
  Filled 2014-03-15 (×2): qty 0.25
  Filled 2014-03-15: qty 1

## 2014-03-15 MED ORDER — GLYBURIDE 2.5 MG PO TABS
2.5000 mg | ORAL_TABLET | Freq: Every day | ORAL | Status: DC
  Administered 2014-03-15: 2.5 mg via ORAL
  Filled 2014-03-15 (×2): qty 1

## 2014-03-15 MED ORDER — ZOLPIDEM TARTRATE 5 MG PO TABS
5.0000 mg | ORAL_TABLET | Freq: Every evening | ORAL | Status: DC | PRN
  Administered 2014-03-15: 5 mg via ORAL
  Filled 2014-03-15: qty 1

## 2014-03-15 MED ORDER — PENICILLIN G POTASSIUM 5000000 UNITS IJ SOLR: 5.0000 10*6.[IU] | Freq: Once | INTRAVENOUS | Status: DC

## 2014-03-15 MED ORDER — ONDANSETRON HCL 4 MG/2ML IJ SOLN
4.0000 mg | Freq: Four times a day (QID) | INTRAMUSCULAR | Status: DC | PRN
  Administered 2014-03-16: 4 mg via INTRAVENOUS
  Filled 2014-03-15: qty 2

## 2014-03-15 MED ORDER — PENICILLIN G POTASSIUM 5000000 UNITS IJ SOLR
2.5000 10*6.[IU] | INTRAVENOUS | Status: DC
  Administered 2014-03-16 – 2014-03-17 (×4): 2.5 10*6.[IU] via INTRAVENOUS
  Filled 2014-03-15 (×8): qty 2.5

## 2014-03-15 MED ORDER — CITRIC ACID-SODIUM CITRATE 334-500 MG/5ML PO SOLN
30.0000 mL | ORAL | Status: DC | PRN
Start: 2014-03-15 — End: 2014-03-17
  Administered 2014-03-17: 30 mL via ORAL
  Filled 2014-03-15: qty 15

## 2014-03-15 MED ORDER — LIDOCAINE HCL (PF) 1 % IJ SOLN
30.0000 mL | INTRAMUSCULAR | Status: DC | PRN
  Filled 2014-03-15: qty 30

## 2014-03-15 NOTE — H&P (Signed)
Pamela Marsh is a 38 y.o. G2P0010 at 486w4d presenting for IOL due to pre-eclampsia- gest htn with neurologic symptoms superimposed on chronic htn. Pt notes no contractions. Good fetal movement, No vaginal bleeding, not leaking fluid. Pt with probable diagnosis of chronic htn as noted by bp's 130s/ 80s in early second trimester with progressive worsening of bp's, despite meds, through the 3rd trimester. Over the past wk pt has been having increased HA and scotomata and waking in the am with bp's 160's/ 100s. Currently only mild HA and no scotomata, no RUQ pain. LGA baby w/ polyhydramnios, grade III placenta and GDM with adequate control by BS log on glyburide.   [facility] 7 wks - Dated by 7 wk u/s and IUI - h/o infertility. Clomid conception for oligomenorrheic infertilty - Began as twin pregnancy with demise of one twin at 7 wks- vanishing twin - Arcuate uterus - Obesity with 39# wt gain this preg - Polyhydramnnios with LGA baby- fetal growth today: 8'8, 95%, AC 37.5 (99%), HC 33.4, AFI 24.9cm (99%), fundal placenta - AMA, nl Informaseq, b/l CPC and EIF present. Also intrauterine synechia seen - Marginal Cord Insertion.  - GDM. On glyburide, adequate control by BS log though polyhydramnios and LGA baby concerning for overall BS control - Chronic htn as above, At 30 wks bp's 150-160's/90's. 31 wks dbp's in low 100s and labetalol 100mg  po tid started. By 36 wks, bp's rising again, At 37 wks, consistently with HA/ scotomata. Serial labs and 24 hr urines have been negative for lab evidence of PEC - 1st trimester bleeding c/w vanishing twin, [facility] Rhogam received at 7 wks, ab screen positive though most of 2nd trimester then converted to negative and pt received 28 wk rhogam dose.    Prenatal Transfer Tool  Maternal Diabetes: Yes:  Diabetes Type:  Insulin/Medication controlled Genetic Screening: Normal Maternal Ultrasounds/Referrals: Normal Fetal  Ultrasounds or other Referrals:  None Maternal Substance Abuse:  No Significant Maternal Medications:  None Significant Maternal Lab Results: None     OB History   Grav Para Term Preterm Abortions TAB SAB Ect Mult Living   2    1  1         Past Medical History  Diagnosis Date  . Mental disorder     hx - no meds  . Sleep apnea     no cpap - lost 25 lbs since study  . Pregnancy induced hypertension     on meds around 33-34wks  . Gestational diabetes     on glyburide-dly  . Infection     UTI  . Depression     hx of - years ago  . Compression fracture 2004    2 lumbar fractures. Healed with brace   Past Surgical History  Procedure Laterality Date  . Right knee surgery      acl  . Dilation and evacuation  07/27/2012    Procedure: DILATATION AND EVACUATION;  Surgeon: Alphonsus SiasKelly A. Ernestina PennaFogleman, MD;  Location: [facility];  Service: Gynecology;  Laterality: N/A;  with ultrasound  . Dilation and curettage of uterus     Family History: family history includes Cancer in her maternal aunt; Diabetes in her father and paternal grandmother; Hyperlipidemia in her father; Hypertension in her father; Obesity in her father; Sleep apnea in her father. Social History:  reports that she quit smoking about 14 years ago. She has never used smokeless tobacco. She reports that she drinks alcohol. She  reports that she does not use illicit drugs. All: opiod sensitivity- nausea  Review of Systems - Negative except Headache and Scotomata   Dilation: Closed Effacement (%): Thick Station: Ballotable Exam by:: L.mearsRN Blood pressure 116/64, pulse 96, temperature 98.2 F (36.8 C), temperature source Oral, resp. rate 18, height 5\' 8"  (1.727 m), weight 116.574 kg (257 lb).  Physical Exam: (seen in office 6/10 at 3p) Gen: well appearing, no distress CV: RRR Pulm: CTAB Back: no CVAT Abd: gravid, NT, no RUQ pain LE: 2+ edema, equal bilaterally, non-tender DTR 1+, no clonus Vitals on admission:  Filed  Vitals:   03/15/14 2025 03/15/14 2035 03/15/14 2045  BP: 146/95  116/64  Pulse: 99  96  Temp: 98.2 F (36.8 C)    TempSrc: Oral    Resp: 18    Height:  5\' 8"  (1.727 m)   Weight:  116.574 kg (257 lb)       Prenatal labs: ABO, Rh: --/--/B NEG (06/10 2030) Antibody: PENDING (06/10 2030) Rubella:  immune RPR: Nonreactive (12/02 0000)  HBsAg: Negative (12/02 0000)  HIV: Non-reactive (12/02 0000)  GBS: Positive (05/27 0000)  1 hr Glucola abnl- GDM  Genetic screening nl Informaseq Anatomy US b/l CPC and EIF, f/u u/s with polydramnios and LGA  CBC    Component Value Date/Time   WBC 16.5* 03/15/2014 2030   RBC 4.26 03/15/2014 2030   HGB 13.4 03/15/2014 2030   HCT 39.2 03/15/2014 2030   PLT 214 03/15/2014 2030   MCV 92.0 03/15/2014 2030   MCH 31.5 03/15/2014 2030   MCHC 34.2 03/15/2014 2030   RDW 13.5 03/15/2014 2030     CMP     Component Value Date/Time   NA 135* 03/15/2014 2030   K 3.8 03/15/2014 2030   CL 101 03/15/2014 2030   CO2 21 03/15/2014 2030   GLUCOSE 126* 03/15/2014 2030   BUN 19 03/15/2014 2030   CREATININE 0.65 03/15/2014 2030   CALCIUM 10.3 03/15/2014 2030   PROT 6.3 03/15/2014 2030   ALBUMIN 2.7* 03/15/2014 2030   AST 28 03/15/2014 2030   ALT 25 03/15/2014 2030   ALKPHOS 97 03/15/2014 2030   BILITOT <0.2* 03/15/2014 2030   GFRNONAA >90 03/15/2014 2030   GFRAA >90 03/15/2014 2030       Assessment/Plan: 38 y.o. G2P0010 at [redacted]w[redacted]d PEC. Worsening gest htn superimposed on chronic htn w/ neurologic sx. Despite nl labs for PEC and low Cr/ U protein, pt with concerning neurologic sx  And worsening bp's as well as grade III aging placenta. Recc move to delivery.   IOL. Will plan o/n ripening and pitocin in am. Careful AROM when head engaged in this pt w/ polydramnios. Pt aware increased risks of c/s w/ IOL but delivery indicated.   GDM. Watch BS while in labor. Cont glyburide tonight.   GBS pos. PCN when in labor.   Bradford Cazier A. 03/15/2014, 10:00 PM

## 2014-03-16 ENCOUNTER — Inpatient Hospital Stay (HOSPITAL_COMMUNITY): Payer: 59 | Admitting: Anesthesiology

## 2014-03-16 ENCOUNTER — Encounter (HOSPITAL_COMMUNITY): Payer: 59 | Admitting: Anesthesiology

## 2014-03-16 LAB — CBC
HCT: 42.5 % (ref 36.0–46.0)
HEMOGLOBIN: 14.7 g/dL (ref 12.0–15.0)
MCH: 32.2 pg (ref 26.0–34.0)
MCHC: 34.6 g/dL (ref 30.0–36.0)
MCV: 93 fL (ref 78.0–100.0)
Platelets: 196 10*3/uL (ref 150–400)
RBC: 4.57 MIL/uL (ref 3.87–5.11)
RDW: 13.5 % (ref 11.5–15.5)
WBC: 18.6 10*3/uL — ABNORMAL HIGH (ref 4.0–10.5)

## 2014-03-16 LAB — GLUCOSE, CAPILLARY
GLUCOSE-CAPILLARY: 86 mg/dL (ref 70–99)
GLUCOSE-CAPILLARY: 91 mg/dL (ref 70–99)
Glucose-Capillary: 83 mg/dL (ref 70–99)
Glucose-Capillary: 159 mg/dL — ABNORMAL HIGH (ref 70–99)
Glucose-Capillary: 136 mg/dL — ABNORMAL HIGH (ref 70–99)

## 2014-03-16 LAB — RPR

## 2014-03-16 MED ORDER — EPHEDRINE 5 MG/ML INJ
10.0000 mg | INTRAVENOUS | Status: DC | PRN
  Filled 2014-03-16: qty 2

## 2014-03-16 MED ORDER — PHENYLEPHRINE 40 MCG/ML (10ML) SYRINGE FOR IV PUSH (FOR BLOOD PRESSURE SUPPORT)
80.0000 ug | PREFILLED_SYRINGE | INTRAVENOUS | Status: AC | PRN
  Administered 2014-03-17 (×3): 80 ug via INTRAVENOUS

## 2014-03-16 MED ORDER — PHENYLEPHRINE 40 MCG/ML (10ML) SYRINGE FOR IV PUSH (FOR BLOOD PRESSURE SUPPORT)
PREFILLED_SYRINGE | INTRAVENOUS | Status: AC
  Filled 2014-03-16: qty 10

## 2014-03-16 MED ORDER — SODIUM BICARBONATE 8.4 % IV SOLN
INTRAVENOUS | Status: DC | PRN
  Administered 2014-03-16: 5 mL via EPIDURAL

## 2014-03-16 MED ORDER — FENTANYL 2.5 MCG/ML BUPIVACAINE 1/10 % EPIDURAL INFUSION (WH - ANES)
14.0000 mL/h | INTRAMUSCULAR | Status: DC | PRN
  Filled 2014-03-16: qty 125

## 2014-03-16 MED ORDER — FENTANYL 2.5 MCG/ML BUPIVACAINE 1/10 % EPIDURAL INFUSION (WH - ANES)
INTRAMUSCULAR | Status: AC
  Administered 2014-03-16: 14 mL/h via EPIDURAL
  Filled 2014-03-16: qty 125

## 2014-03-16 MED ORDER — FENTANYL 2.5 MCG/ML BUPIVACAINE 1/10 % EPIDURAL INFUSION (WH - ANES)
14.0000 mL/h | INTRAMUSCULAR | Status: DC | PRN
  Administered 2014-03-16: 14 mL/h via EPIDURAL

## 2014-03-16 MED ORDER — EPHEDRINE 5 MG/ML INJ
INTRAVENOUS | Status: AC
  Filled 2014-03-16: qty 4

## 2014-03-16 MED ORDER — LACTATED RINGERS IV SOLN: 500.0000 mL | Freq: Once | INTRAVENOUS | Status: DC

## 2014-03-16 MED ORDER — DIPHENHYDRAMINE HCL 50 MG/ML IJ SOLN: 12.5000 mg | INTRAMUSCULAR | Status: DC | PRN

## 2014-03-16 MED ORDER — PHENYLEPHRINE 40 MCG/ML (10ML) SYRINGE FOR IV PUSH (FOR BLOOD PRESSURE SUPPORT)
80.0000 ug | PREFILLED_SYRINGE | INTRAVENOUS | Status: DC | PRN
  Administered 2014-03-17: 80 ug via INTRAVENOUS
  Filled 2014-03-16: qty 10
  Filled 2014-03-16: qty 2

## 2014-03-16 NOTE — Progress Notes (Signed)
S: Doing well, no complaints, pain well controlled not yet ready for epidural. No HA, no vision change.   O: BP 131/86  Pulse 91  Temp(Src) 97.9 F (36.6 C) (Oral)  Resp 18  Ht 5\' 8"  (1.727 m)  Wt 116.574 kg (257 lb)  BMI 39.09 kg/m2   FHT:  FHR: 130s bpm, variability: moderate,  accelerations:  Present,  decelerations:  Absent UC:   irregular, every 3-7 minutes SVE:   Dilation: Fingertip Effacement (%): 50 Station: -3 Exam by:: Felkel,RN  Speculum exam placed but unable to adequately visualize cervix due to pt's intolerance to exam. Speculum removed and foley placed manually, filled with 60cc saline and placed on traction.    A / P:  38 y.o.  Obstetric History   G2   P0   T0   P0   A1   TAB0   SAB1   E0   M0   L0    at [redacted]w[redacted]d IOL for pre-eclampsia. stable labs, symptoms improved, bp stable. Cont pitocin, foley bulb now placed  Fetal Wellbeing:  Category I Pain Control:  Labor support without medications  Anticipated MOD:  NSVD  Pamela Marsh A. 03/16/2014, 1:14 PM

## 2014-03-16 NOTE — Progress Notes (Signed)
S: Doing well, no complaints, pain well controlled with epidural  O: BP 127/70  Pulse 90  Temp(Src) 99 F (37.2 C) (Axillary)  Resp 18  Ht 5\' 8"  (1.727 m)  Wt 116.574 kg (257 lb)  BMI 39.09 kg/m2  SpO2 96%   FHT:  FHR: 140s bpm, variability: moderate,  accelerations:  Present,  decelerations:  Absent UC:   regular, every 3 minutes SVE:   Dilation: 8 Effacement (%): 50 Station: 0 Exam by:: A.Davis,RN   A / P:  38 y.o.  Obstetric History   G2   P0   T0   P0   A1   TAB0   SAB1   E0   M0   L0    at [redacted]w[redacted]d IOL for PEC, GDM  Fetal Wellbeing:  Category I Pain Control:  Epidural  Anticipated MOD:  NSVD  Sheriece Jefcoat A. 03/16/2014, 9:17 PM

## 2014-03-16 NOTE — Anesthesia Preprocedure Evaluation (Signed)
Anesthesia Evaluation  Patient identified by MRN, date of birth, ID band Patient awake    Reviewed: Allergy & Precautions, H&P , Patient's Chart, lab work & pertinent test results  Airway Mallampati: II TM Distance: >3 FB Neck ROM: full    Dental  (+) Teeth Intact   Pulmonary sleep apnea , former smoker,  breath sounds clear to auscultation        Cardiovascular hypertension, On Medications and On Home Beta Blockers Rhythm:regular Rate:Normal     Neuro/Psych    GI/Hepatic   Endo/Other  diabetes, GestationalMorbid obesity  Renal/GU      Musculoskeletal   Abdominal   Peds  Hematology   Anesthesia Other Findings       Reproductive/Obstetrics (+) Pregnancy                           Anesthesia Physical Anesthesia Plan  ASA: III  Anesthesia Plan: Epidural   Post-op Pain Management:    Induction:   Airway Management Planned:   Additional Equipment:   Intra-op Plan:   Post-operative Plan:   Informed Consent: I have reviewed the patients History and Physical, chart, labs and discussed the procedure including the risks, benefits and alternatives for the proposed anesthesia with the patient or authorized representative who has indicated his/her understanding and acceptance.   Dental Advisory Given  Plan Discussed with:   Anesthesia Plan Comments: (Labs checked- platelets confirmed with RN in room. Fetal heart tracing, per RN, reported to be stable enough for sitting procedure. Discussed epidural, and patient consents to the procedure:  included risk of possible headache,backache, failed block, allergic reaction, and nerve injury. This patient was asked if she had any questions or concerns before the procedure started.)        Anesthesia Quick Evaluation

## 2014-03-16 NOTE — Anesthesia Procedure Notes (Signed)

## 2014-03-16 NOTE — Progress Notes (Signed)
S: Doing well, no complaints, pain well controlled on epidural. S/p Foley bulb then SROM  O: BP 144/80  Pulse 86  Temp(Src) 98.3 F (36.8 C) (Axillary)  Resp 16  Ht 5\' 8"  (1.727 m)  Wt 116.574 kg (257 lb)  BMI 39.09 kg/m2  SpO2 96%   FHT:  FHR: 140s bpm, variability: moderate,  accelerations:  Present,  decelerations:  Present rare quick variable decels UC:   regular, every 4 minutes SVE:   Dilation: Fingertip Effacement (%): 50 Station: -3 Exam by:: Felkel,RN  34/50/-2 Large amount of clear fluid leaking with exam   A / P:  38 y.o.  Obstetric History   G2   P0   T0   P0   A1   TAB0   SAB1   E0   M0   L0    at [redacted]w[redacted]d IOL due to PEC  Fetal Wellbeing:  Category I Pain Control:  Epidural  Anticipated MOD:  NSVD  Sissy Goetzke A. 03/16/2014, 4:29 PM

## 2014-03-17 ENCOUNTER — Encounter (HOSPITAL_COMMUNITY): Payer: Self-pay | Admitting: *Deleted

## 2014-03-17 LAB — GLUCOSE, CAPILLARY
GLUCOSE-CAPILLARY: 118 mg/dL — AB (ref 70–99)
GLUCOSE-CAPILLARY: 98 mg/dL (ref 70–99)

## 2014-03-17 SURGERY — DILATION AND CURETTAGE

## 2014-03-17 MED ORDER — ONDANSETRON HCL 4 MG PO TABS: 4.0000 mg | ORAL_TABLET | ORAL | Status: DC | PRN

## 2014-03-17 MED ORDER — LANOLIN HYDROUS EX OINT: TOPICAL_OINTMENT | CUTANEOUS | Status: DC | PRN

## 2014-03-17 MED ORDER — NITROGLYCERIN 0.4 MG/SPRAY TL SOLN
Status: AC
  Filled 2014-03-17: qty 4.9

## 2014-03-17 MED ORDER — ZOLPIDEM TARTRATE 5 MG PO TABS: 5.0000 mg | ORAL_TABLET | Freq: Every evening | ORAL | Status: DC | PRN

## 2014-03-17 MED ORDER — LIDOCAINE-EPINEPHRINE (PF) 2 %-1:200000 IJ SOLN
INTRAMUSCULAR | Status: AC
  Filled 2014-03-17: qty 20

## 2014-03-17 MED ORDER — DIPHENHYDRAMINE HCL 25 MG PO CAPS
25.0000 mg | ORAL_CAPSULE | Freq: Four times a day (QID) | ORAL | Status: DC | PRN
Start: 2014-03-17 — End: 2014-03-19

## 2014-03-17 MED ORDER — IBUPROFEN 600 MG PO TABS
600.0000 mg | ORAL_TABLET | Freq: Four times a day (QID) | ORAL | Status: DC
  Administered 2014-03-17 – 2014-03-19 (×10): 600 mg via ORAL
  Filled 2014-03-17 (×10): qty 1

## 2014-03-17 MED ORDER — PHENYLEPHRINE HCL 10 MG/ML IJ SOLN
INTRAMUSCULAR | Status: DC | PRN
  Administered 2014-03-17 (×5): 80 ug via INTRAVENOUS

## 2014-03-17 MED ORDER — OXYCODONE-ACETAMINOPHEN 5-325 MG PO TABS
1.0000 | ORAL_TABLET | ORAL | Status: DC | PRN
  Administered 2014-03-17: 1 via ORAL
  Administered 2014-03-17 (×2): 2 via ORAL
  Administered 2014-03-19: 1 via ORAL
  Administered 2014-03-19: 2 via ORAL
  Administered 2014-03-19: 1 via ORAL
  Filled 2014-03-17: qty 1
  Filled 2014-03-17: qty 2
  Filled 2014-03-17: qty 1
  Filled 2014-03-17 (×2): qty 2
  Filled 2014-03-17: qty 1
  Filled 2014-03-17: qty 2
  Filled 2014-03-17: qty 1

## 2014-03-17 MED ORDER — PRENATAL MULTIVITAMIN CH
1.0000 | ORAL_TABLET | Freq: Every day | ORAL | Status: DC
  Administered 2014-03-17 – 2014-03-19 (×3): 1 via ORAL
  Filled 2014-03-17 (×3): qty 1

## 2014-03-17 MED ORDER — SODIUM CHLORIDE 0.9 % IV SOLN
3.0000 g | Freq: Once | INTRAVENOUS | Status: DC
  Administered 2014-03-17: 3 g via INTRAVENOUS
  Filled 2014-03-17: qty 3

## 2014-03-17 MED ORDER — BENZOCAINE-MENTHOL 20-0.5 % EX AERO
1.0000 "application " | INHALATION_SPRAY | CUTANEOUS | Status: DC | PRN
  Administered 2014-03-17 – 2014-03-19 (×2): 1 via TOPICAL
  Filled 2014-03-17: qty 56

## 2014-03-17 MED ORDER — WITCH HAZEL-GLYCERIN EX PADS: 1.0000 "application " | MEDICATED_PAD | CUTANEOUS | Status: DC | PRN

## 2014-03-17 MED ORDER — ONDANSETRON HCL 4 MG/2ML IJ SOLN: 4.0000 mg | INTRAMUSCULAR | Status: DC | PRN

## 2014-03-17 MED ORDER — FLEET ENEMA 7-19 GM/118ML RE ENEM: 1.0000 | ENEMA | Freq: Every day | RECTAL | Status: DC | PRN

## 2014-03-17 MED ORDER — TETANUS-DIPHTH-ACELL PERTUSSIS 5-2.5-18.5 LF-MCG/0.5 IM SUSP: 0.5000 mL | Freq: Once | INTRAMUSCULAR | Status: DC

## 2014-03-17 MED ORDER — SIMETHICONE 80 MG PO CHEW: 80.0000 mg | CHEWABLE_TABLET | ORAL | Status: DC | PRN

## 2014-03-17 MED ORDER — BISACODYL 10 MG RE SUPP: 10.0000 mg | Freq: Every day | RECTAL | Status: DC | PRN

## 2014-03-17 MED ORDER — SENNOSIDES-DOCUSATE SODIUM 8.6-50 MG PO TABS
2.0000 | ORAL_TABLET | ORAL | Status: DC
  Administered 2014-03-17 – 2014-03-18 (×2): 2 via ORAL
  Filled 2014-03-17 (×2): qty 2

## 2014-03-17 MED ORDER — DIBUCAINE 1 % RE OINT: 1.0000 "application " | TOPICAL_OINTMENT | RECTAL | Status: DC | PRN

## 2014-03-17 MED ORDER — NITROGLYCERIN 0.4 MG/SPRAY TL SOLN
Status: DC | PRN
  Administered 2014-03-17 (×2): 2 via SUBLINGUAL

## 2014-03-17 NOTE — Consult Note (Signed)
Neonatology Note:   Attendance at Delivery:    I was asked by Dr. Ernestina PennaFogleman to attend this NSVD at 37 6/7 weeks [facility]. The mother is a G2P0A1 B neg, GBS positive with PIH superimposed on chronic HTN (labetalol), GDM on glyburide, polyhydramnios, known LGA infant, Grade 3 placenta, arcuate uterus, and first trimester bleeding felt to be consistent with vanishing twin (conception was on Clomid). ROM 15 hours prior to delivery, fluid meconium-stained. Mother received Pen G > 4 hours prior to delivery and she was afebrile during labor. There were FHR decelerations during labor. At delivery, infant was floppy, pale, and apneic, but had a HR > 100. I bulb suctioned while the cord was being clamped and cut, getting large amounts of thick, light green mucous. I got more of the same when I bulb suctioned her again on the warming bed. I then applied PPV. She had improvement in color very quickly and chest excursion could be seen. She got PPV for 2 minutes, then she began to breathe on her own, although she did not cry yet. She began to cry softly at about 4 minutes. We placed a pulse oximeter and the O2 saturations were in the low 70s, so we gave BBO2 until she was about 5 minutes old. Ap 3/8/9. Lungs clear to ausc in DR, without distress. Tone gradually returned and was normal by 10 minutes. Cord pH was 7.10. She was wrapped and held by her mother briefly in the DR, then was transported to the NICU for further care. Her father was in attendance.   Pamela Souhristie C. Kamaria Lucia, MD

## 2014-03-17 NOTE — Lactation Note (Signed)
This note was copied from the chart of Pamela Bing NeighborsKimberly Kuang. Lactation Consultation Note  Patient Name: Pamela Marsh IONGE'XToday's Date: 03/17/2014 Reason for consult: Initial assessment Baby is in NICU. Mother has recently pumped and expressed a "few drops" which she took to her baby. Basic teaching about pumping frequency and establishing milk supply.Mother was shown had to do hand expression. Mother has a good flow of colostrum and was collected in small container to take to her baby. Father in from NICU with update that their baby was doing well, mother became emotional and wanted to go visit baby in NICU. Mom to NICU via W/C with Dad. LC to follow.Mom made aware of O/P services, breastfeeding support groups, community resources, and our phone # for post-discharge questions.   Maternal Data Formula Feeding for Exclusion: Yes  Feeding    LATCH Score/Interventions                      Lactation Tools Discussed/Used WIC Program: No Pump Review: Setup, frequency, and cleaning;Milk Storage Initiated by:: set up per RN, reinforced teaching Date initiated:: 03/17/14   Consult Status Consult Status: Follow-up Date: 03/18/14 Follow-up type: In-patient    Christella HartiganDaly, Selin Eisler M 03/17/2014, 4:29 PM

## 2014-03-17 NOTE — Progress Notes (Signed)
Patient ID: Pamela Marsh, female   DOB: 10-31-1975, 38 y.o.   MRN: [id number] INTERVAL NOTE:  S:   Sitting up in bed eating lunch, min cramping, (+) voids, small bleed, denies HA/NV/dizziness, concerned about baby in NICU with multiple questions re: GBS and MSF - awaiting updates  O:   VSS, AAO x 3, NAD  Scant lochia per pt  A / P:   PPD #0  Retained placenta - coverage with IV Unasyn x 1 dose  Stable post partum  Routine PP orders  Refer concerning baby questions to neonatologist  Kenard GowerAWSON, Pamela Marsh, M, MSN, CNM 03/17/2014, 12:56 PM

## 2014-03-17 NOTE — Anesthesia Postprocedure Evaluation (Signed)
Anesthesia Post Note  Patient: Pamela Marsh  Procedure(s) Performed: * No procedures listed *  Anesthesia type: Epidural  Patient location: Mother/Baby  Post pain: Pain level controlled  Post assessment: Post-op Vital signs reviewed  Last Vitals:  Filed Vitals:   03/17/14 1137  BP: 122/62  Pulse: 94  Temp: 37 C  Resp: 18    Post vital signs: Reviewed  Level of consciousness:alert  Complications: No apparent anesthesia complications

## 2014-03-17 NOTE — Progress Notes (Signed)
Clinical Social Work Department PSYCHOSOCIAL ASSESSMENT - MATERNAL/CHILD 03/17/2014  Patient:  Pamela, Marsh  Account Number:  [id number]  Admit Date:  03/15/2014  Ardine Eng Name:   Pamela Marsh    Clinical Social Worker:  Terri Piedra, LCSW   Date/Time:  03/17/2014 03:15 PM  Date Referred:        Other referral source:   No referral/NICU admission    I:  FAMILY / Berry legal guardian:  PARENT  Guardian - Name Pamela Marsh - Age Pamela Marsh, [name], [name]  Pamela Marsh  same   Other household support members/support persons Name Relationship DOB   OTHER 15   OTHER 13   Other support:   FOB's two daughters live in the home with them.  Parents report having a good support system of family, church and friends.    II  PSYCHOSOCIAL DATA Information Source:  Family Interview  Occupational hygienist Employment:   MOB-[employer] Academic librarian resources:  Multimedia programmer If Swain:    School / Grade:   Maternity Care Coordinator / Child Services Coordination / Early Interventions:  Cultural issues impacting care:   Faith is important to the family    III  STRENGTHS Strengths  Adequate Resources  Compliance with medical plan  Home prepared for Child (including basic supplies)  Other - See comment  Supportive family/friends  Understanding of illness   Strength comment:  Pediatric follow up will be with Dr. Arlyce Marsh at [facility].   IV  RISK FACTORS AND CURRENT PROBLEMS Current Problem:  None   Risk Factor & Current Problem Patient Issue Family Issue Risk Factor / Current Problem Comment   N N     V  SOCIAL WORK ASSESSMENT  CSW met with MOB in her third floor room to introduce myself, offer support and complete assessment due to baby's admission to  NICU.  MOB was welcoming and pleasant.  She states she and baby are doing well, but she seemed to not know the entire situation as to why her daughter had been admitted to the NICU.  She was not upset about this, but CSW felt she could benefit from an update when her husband arrived.  (CSW requested that NNP/Rachel meet with parents to provide an update.)  CSW feels they are still very much processing the birth of their daughter, which did not go as they envisioned.  MOB's main focus at this moment is pumping.  She states she has not yet pumped and wants to pump as soon as possible.  She states lactation is on their way to help her.  MOB states they have everything they need for baby at home and a good support system.  CSW explained ongoing support services offered by NICU CSW and provided contact information.  CSW asked MOB about documentation in her medical record of a Bipolar dx.  She states no symptoms or concerns at this time.  She reports feeling that her symptoms were more a result of a skydiving accident years ago, and not truly Bipolar.  She states she was on medication at one time, but weaned herself off when she met her husband, with his support.  She states no concerns at this time, but knows what to watch for and what to do if she has concerns at any time.  CSW discussed common emotions related to  the NICU admission, as well as PPD signs and symptoms.  CSW feels it may have been too soon for MOB to process these things.  CSW encouraged her to call her doctor or CSW if she has questions or concerns about her emotions at any time.  MOB's RN came to assist MOB with starting to pump and CSW left the room.  CSW saw FOB in the hall and spoke with him.  He seemed very eager to talk about the experience and reports that very little about MOB's pregnancy went as hoped.  He stated that they lost the twin early on in this pregnancy and that MOB had a miscarriage with her last pregnancy.  FOB also states that they  received scary news from ultrasounds that turned out to be nothing to worry about.  FOB seemed more tearful than MOB and describes her as the strong one.  He told CSW about his two older daughters, one whom was with him at this time.  CSW thanked him for sharing his story and asked that he call if he has any questions, concerns or needs while baby is in the NICU.  CSW has no social concerns at this time.   VI SOCIAL WORK PLAN Social Work Plan  Psychosocial Support/Ongoing Assessment of Needs  Patient/Family Education   Type of pt/family education:   Ongoing supports offered by NICU CSW  PPD signs and symptoms   If child protective services report - county:   If child protective services report - date:   Information/referral to community resources comment:   No referral needs noted at this time.   Other social work plan:

## 2014-03-18 LAB — CBC
HEMATOCRIT: 33.6 % — AB (ref 36.0–46.0)
Hemoglobin: 11.4 g/dL — ABNORMAL LOW (ref 12.0–15.0)
MCH: 31.9 pg (ref 26.0–34.0)
MCHC: 34.2 g/dL (ref 30.0–36.0)
MCV: 93.3 fL (ref 78.0–100.0)
Platelets: 190 10*3/uL (ref 150–400)
RBC: 3.6 MIL/uL — AB (ref 3.87–5.11)
RDW: 13.8 % (ref 11.5–15.5)
WBC: 18.4 10*3/uL — ABNORMAL HIGH (ref 4.0–10.5)

## 2014-03-18 LAB — COMPREHENSIVE METABOLIC PANEL
ALT: 26 U/L (ref 0–35)
AST: 45 U/L — ABNORMAL HIGH (ref 0–37)
Albumin: 2.2 g/dL — ABNORMAL LOW (ref 3.5–5.2)
Alkaline Phosphatase: 73 U/L (ref 39–117)
BUN: 11 mg/dL (ref 6–23)
CALCIUM: 10.2 mg/dL (ref 8.4–10.5)
CO2: 25 mEq/L (ref 19–32)
CREATININE: 0.62 mg/dL (ref 0.50–1.10)
Chloride: 105 mEq/L (ref 96–112)
GFR calc non Af Amer: >90 mL/min (ref >90)
Glucose, Bld: 77 mg/dL (ref 70–99)
Potassium: 4 mEq/L (ref 3.7–5.3)
Sodium: 139 mEq/L (ref 137–147)
Total Bilirubin: <0.2 mg/dL — ABNORMAL LOW (ref 0.3–1.2)
Total Protein: 5.1 g/dL — ABNORMAL LOW (ref 6.0–8.3)

## 2014-03-18 LAB — GLUCOSE, CAPILLARY: GLUCOSE-CAPILLARY: 91 mg/dL (ref 70–99)

## 2014-03-18 LAB — URIC ACID: URIC ACID, SERUM: 6.2 mg/dL (ref 2.4–7.0)

## 2014-03-18 MED ORDER — RHO D IMMUNE GLOBULIN 1500 UNIT/2ML IJ SOSY
300.0000 ug | PREFILLED_SYRINGE | Freq: Once | INTRAMUSCULAR | Status: AC
  Administered 2014-03-18: 300 ug via INTRAMUSCULAR
  Filled 2014-03-18: qty 2

## 2014-03-18 NOTE — Progress Notes (Signed)
PPD #1- SVD  Subjective:   Reports feeling well Tolerating po/ No nausea or vomiting No HA, visual changes, or epigastric pain Bleeding is light Pain controlled with Motrin and Percocet Up ad lib / ambulatory / voiding without problems Newborn: breastpumping, NICU, doing well   Objective:   VS:  VS:  Filed Vitals:   03/17/14 0800 03/17/14 1137 03/17/14 2112 03/18/14 0557  BP: 150/86 122/62 144/77 127/63  Pulse: 103 94 103 82  Temp: 98.4 F (36.9 C) 98.6 F (37 C) 98.2 F (36.8 C) 98.1 F (36.7 C)  TempSrc: Oral  Oral Oral  Resp: 20 18 18 18   Height:      Weight:      SpO2: 100% 99% 100% 99%    LABS:  Recent Labs  03/16/14 1445 03/18/14 0515  WBC 18.6* 18.4*  HGB 14.7 11.4*  PLT 196 190   Blood type: --/--/B NEG (06/13 0515) Rubella: Immune (12/02 0000)   I&O: Intake/Output     06/12 0701 - 06/13 0700 06/13 0701 - 06/14 0700   Urine (mL/kg/hr)     Blood     Total Output       Net              Physical Exam: Alert and oriented x3 Abdomen: soft, non-tender, non-distended  Fundus: firm, non-tender, U-1 Perineum: Well approximated, no significant erythema, edema, or drainage; healing well. Lochia: small Extremities: No edema, no calf pain or tenderness    Assessment:  PPD # 1G2P1011/ S/P:induced vaginal, 2nd degree laceration Chronic HTN w/superimposed PEC, delivered-stable A2GDM, delivered Rh negative  Doing well    Plan: Continue routine post partum orders Rhogam if indicated Anticipate D/C home tomorrow   Donette LarryBHAMBRI, Ancel Easler, N MSN, CNM 03/18/2014, 11:31 AM

## 2014-03-18 NOTE — Plan of Care (Signed)
Problem: Phase II Progression Outcomes Goal: Rh isoimmunization per orders Outcome: Completed/Met Date Met:  03/18/14 Rhogam  Done.  Problem: Discharge Progression Outcomes Goal: Barriers To Progression Addressed/Resolved Outcome: Completed/Met Date Met:  03/18/14 Afebrile since treated with Abx after hard to remove placenta. Goal: Activity appropriate for discharge plan Outcome: Completed/Met Date Met:  03/18/14 Walks in hallways frequently and tolerates well. Goal: Complications resolved/controlled Outcome: Completed/Met Date Met:  03/18/14 Has voided since Foley removed. Goal: Pain controlled with appropriate interventions Outcome: Completed/Met Date Met:  03/18/14 Good pain control on PO Motrin and Percocet.

## 2014-03-18 NOTE — Lactation Note (Signed)
This note was copied from the chart of Pamela Marsh. Lactation Consultation Note   Follow up consult with this mom and term baby, in NICU, with dx IDM, hypoglycemia and R/O sepsis. I assisted mom with latching her very sleepy baby. She would not latch, so I syringe fed about 1 ml of colostrum, and she woke up some. Mom has very soft breasts with flat nipples, so I showed mom how to attach a 20 nipple shiled. It took about 15 minutes, but eventually, with EBM fed into the shield, Pamela Marsh began with a rhythmic sucking pattern, and swallowed a total of 5 mls of EBM. She kept sucking after the syring milk was gone, and there was a good amount of milk in the shiled. This makes me feels she transferred more than 5 mls EBM. Mom is going to pump every 3 hours, after breast feeding, and add hand expression. Mom will need help applying nipple shield, and [name], RN is going to help mom with next feeding.Feeding plan discussed with Pamela Marsh, Mom aware if blood sugaars do not come up, and she does not express enough transitional milk, we may have to add formula. Mom seemed fine with this.   Patient Name: Pamela Marsh's Date: 03/18/2014 Reason for consult: Follow-up assessment;NICU baby   Maternal Data    Feeding Feeding Type: Breast Milk Length of feed: 30 min (about 10 minutes of active sucking)  LATCH Score/Interventions Latch: Repeated attempts needed to sustain latch, nipple held in mouth throughout feeding, stimulation needed to elicit sucking reflex. (baby reluctant at first, but after about 15 minutes of skin to skin and getly placing baby on nipple shiled, she began sucking) Intervention(s): Adjust position;Assist with latch;Breast massage;Breast compression  Audible Swallowing: A few with stimulation Intervention(s): Skin to skin;Hand expression;Alternate breast massage  Type of Nipple: Flat Intervention(s): Double electric pump  Comfort (Breast/Nipple): Soft / non-tender      Hold (Positioning): Assistance needed to correctly position infant at breast and maintain latch. Intervention(s): Breastfeeding basics reviewed;Support Pillows;Position options;Skin to skin  LATCH Score: 6  Lactation Tools Discussed/Used Tools: Pump;Nipple Shields Nipple shield size: 20 Breast pump type: Double-Electric Breast Pump Pump Review: Setup, frequency, and cleaning;Other (comment) (hand expression, premie setting)   Consult Status Consult Status: Follow-up Date: 03/19/14 Follow-up type: In-patient    Pamela LevinsLee, Shaelin Lalley Marsh 03/18/2014, 2:17 PM

## 2014-03-19 LAB — TYPE AND SCREEN
ABO/RH(D): B NEG
Antibody Screen: POSITIVE
DAT, IGG: NEGATIVE
Unit division: 0
Unit division: 0

## 2014-03-19 LAB — RH IG WORKUP (INCLUDES ABO/RH)
ABO/RH(D): B NEG
Fetal Screen: NEGATIVE
GESTATIONAL AGE(WKS): 37.6
Unit division: 0

## 2014-03-19 MED ORDER — LABETALOL HCL 100 MG PO TABS
100.0000 mg | ORAL_TABLET | Freq: Two times a day (BID) | ORAL | Status: DC
  Administered 2014-03-19: 100 mg via ORAL
  Filled 2014-03-19 (×3): qty 1

## 2014-03-19 MED ORDER — IBUPROFEN 600 MG PO TABS: 600.0000 mg | ORAL_TABLET | Freq: Four times a day (QID) | ORAL | Status: DC

## 2014-03-19 MED ORDER — OXYCODONE-ACETAMINOPHEN 5-325 MG PO TABS: 1.0000 | ORAL_TABLET | ORAL | Status: DC | PRN

## 2014-03-19 NOTE — Progress Notes (Signed)
Discharge instructions provided to patient at bedside.  Activity, medications, follow up appointments, when to call the doctor and community resources discussed.  No questions at this time.  Patient left unit in stable condtition with all personal belongings and prescriptions.  Accompanied patient to NICU to room in for the night.  Osvaldo AngstK. Aylani Spurlock, RN------------------------

## 2014-03-19 NOTE — Progress Notes (Signed)
PPD #2- SVD  Subjective:   Reports feeling well No HA, visual changes, or epigastric pain Tolerating po/ No nausea or vomiting Bleeding is light Pain controlled with Motrin and Percocet Up ad lib / ambulatory / voiding without problems Newborn: breastfeeding    Objective:   VS: VS:  Filed Vitals:   03/18/14 2134 03/19/14 0220 03/19/14 0533 03/19/14 0947  BP: 156/89 136/86 123/60 158/91  Pulse: 102 105 86 113  Temp: 98.6 F (37 C) 99.4 F (37.4 C) 98.2 F (36.8 C) 98.5 F (36.9 C)  TempSrc: Oral Oral Oral Oral  Resp: 18 16 16 18   Height:      Weight:      SpO2: 98% 100% 99% 98%    LABS:  Recent Labs  03/16/14 1445 03/18/14 0515  WBC 18.6* 18.4*  HGB 14.7 11.4*  PLT 196 190   Blood type: --/--/B NEG (06/13 0515) Rubella: Immune (12/02 0000)                I&O: Intake/Output     06/13 0701 - 06/14 0700 06/14 0701 - 06/15 0700   P.O. 1200 360   Total Intake(mL/kg) 1200 (10.3) 360 (3.1)   Net +1200 +360        Urine Occurrence 3 x      Physical Exam: Alert and oriented X3 Abdomen: soft, non-tender, non-distended  Fundus: firm, non-tender, U-1 Perineum: Well approximated, no significant erythema, edema, or drainage; healing well. Lochia: small Extremities: No edema, no calf pain or tenderness    Assessment: PPD # 2 G2P1011/ S/P:induced vaginal, 2nd degree laceration Chronic HTN, s/i PEC, delivered A2GDM, delivered Doing well - stable for discharge home   Plan: Discharge home RX's:  Ibuprofen 600mg  po Q 6 hrs prn pain #30 Refill x 0 Labetalol 100mg  po bid Percocet 5/325 1 to 2 po Q 4 hrs prn pain #30 Refill x 0 Routine pp visit in [employer] booklet given    Pamela LarryBHAMBRI, Pamela Marsh, N MSN, CNM 03/19/2014, 12:33 PM

## 2014-03-19 NOTE — Discharge Summary (Signed)
DISCHARGE SUMMARY:  Patient ID: Pamela LemmingsKimberly D Handley MRN: [id number] DOB/AGE: 02-19-1976 38 y.o.  Admit date: 03/15/2014 Admission Diagnoses: 37.[redacted] weeks gestation, Chronic HTN with superimposed PEC, A2GDM   Discharge date: 03/19/2014 Discharge Diagnoses: S/P induced vaginal delivery on 03/17/14, PEC, delivered, A2GDM, delivered, Rh negative        Prenatal history: G2P1011   EDC : 04/01/2014, Alternate EDD Entry  [facility] [redacted] wks gestation. Primary provider : Dr. Ernestina PennaFogleman Prenatal course complicated by polyhydramnios, LGA, chronic HTN with superimposed PEC, A2GDM, vanishing twin first trimester, arcuate uterus, marginal CI, AMA, and positive antibody screen converted to neg.  Prenatal labs: ABO, Rh: --/--/B NEG (06/13 0515) / Rhophylac  Antibody: POS (06/10 2030) Rubella:   / Immune RPR: NON REAC (06/10 2030)  HBsAg: Negative (12/02 0000)  HIV: Non-reactive (12/02 0000)  GBS: Positive (05/27 0000)  GTT: 155, failed 3 hr  Medical / Surgical History :  Past medical history:  Past Medical History  Diagnosis Date  . Mental disorder     hx - no meds  . Sleep apnea     no cpap - lost 25 lbs since study  . Pregnancy induced hypertension     on meds around 33-34wks  . Gestational diabetes     on glyburide-dly  . Infection     UTI  . Depression     hx of - years ago  . Compression fracture 2004    2 lumbar fractures. Healed with brace    Past surgical history:  Past Surgical History  Procedure Laterality Date  . Right knee surgery      acl  . Dilation and evacuation  07/27/2012    Procedure: DILATATION AND EVACUATION;  Surgeon: Alphonsus SiasKelly A. Ernestina PennaFogleman, MD;  Location: [facility];  Service: Gynecology;  Laterality: N/A;  with ultrasound  . Dilation and curettage of uterus       Medications on Admission: No prescriptions prior to admission    Allergies: Hydrocodone   Intrapartum Course:  Admited for IOL, cervical  ripening with balloon, SROM, GBS prophylaxis, epidural, pitocin augmentation, SVD.  Postpartum Course: elevated BPs on PPD #2, asymptomatic- Labetalol 100 mg bid restarted  Physical Exam:   VSS: Blood pressure 140/89, pulse 89, temperature 97.6 F (36.4 C), temperature source Oral, resp. rate 19, height 5\' 8"  (1.727 m), weight 116.574 kg (257 lb), SpO2 99.00%, unknown if currently breastfeeding.  LABS:  Recent Labs  03/18/14 0515  WBC 18.4*  HGB 11.4*  PLT 190    Newborn Data Live born female  Birth Weight: 7 lb 1.9 oz (3230 g) APGAR: 3, 8  See operative report for further details  NICU-stable  Discharge Instructions:  Postpartum Instructions: [facility] discharge booklet - instructions reviewed Medications:    Medication List    STOP taking these medications       acetaminophen 325 MG tablet  Commonly known as:  TYLENOL     glyBURIDE 2.5 MG tablet  Commonly known as:  DIABETA     loratadine 10 MG tablet  Commonly known as:  CLARITIN      TAKE these medications       fluticasone 27.5 MCG/SPRAY nasal spray  Commonly known as:  VERAMYST  Place 2 sprays into the nose daily.     ibuprofen 600 MG tablet  Commonly known as:  ADVIL,MOTRIN  Take 1 tablet (600 mg total) by mouth every 6 (six) hours.     labetalol 100 MG  tablet  Commonly known as:  NORMODYNE  Take 100 mg by mouth 3 (three) times daily.     oxyCODONE-acetaminophen 5-325 MG per tablet  Commonly known as:  PERCOCET/ROXICET  Take 1-2 tablets by mouth every 4 (four) hours as needed for severe pain (moderate - severe pain).     prenatal multivitamin Tabs tablet  Take 1 tablet by mouth daily at 12 noon.            Follow-up Information   [facility] In 6 weeks. (and blood pressue check this Thursday or Friday)    Specialty:  Obstetrics and Gynecology   Contact information:   [address] [place] [address] [phone]         Signed: Lawernce PittsBHAMBRI, Gean Larose,  N WHNP-BC, MSN 03/19/2014, 7:40 PM

## 2014-03-19 NOTE — Lactation Note (Signed)
This note was copied from the chart of Pamela Bing NeighborsKimberly Viles. Lactation Consultation Note  Report from Women's unit RN, mom is being discharged to room in with baby in NICU and declines any assist at this time.  Patient Name: Pamela Marsh Pamela Marsh: 03/19/2014     Maternal Data    Feeding Feeding Type: Breast Fed Length of feed: 30 min  LATCH Score/Interventions Latch: Grasps breast easily, tongue down, lips flanged, rhythmical sucking. Intervention(s): Adjust position  Audible Swallowing: Spontaneous and intermittent  Type of Nipple: Flat Intervention(s): Shells  Comfort (Breast/Nipple): Soft / non-tender     Hold (Positioning): No assistance needed to correctly position infant at breast.  LATCH Score: 9  Lactation Tools Discussed/Used     Consult Status      Jenet Durio, Arvella MerlesJana Lynn 03/19/2014, 6:14 PM

## 2014-03-20 ENCOUNTER — Ambulatory Visit: Payer: Self-pay

## 2014-03-20 NOTE — Lactation Note (Signed)
This note was copied from the chart of Pamela Bing NeighborsKimberly Ackman. Lactation Consultation Note  Patient Name: Pamela Marsh [id number]'s Date: 03/20/2014 Reason for consult: Follow-up assessment;NICU baby  Was called to assist with a 1300 feeding.  Mom is only able to pump about 1-2 ml each pumping session.  Taught mom how to use hands-on pumping technique during pumping session to maximize milk output.  Infant is on day 4 of life.  Mom had been using a #20 nipple shield with latching infant.  Last feeding 3 hrs ago 90 ml.  Infant was beginning to show some feeding cues (minor rooting) upon entering room.  Mom put infant STS with her and infant was eager to latch.  Infant easily latched on right side in football hold without shield with very minimal [facility] and fed for 30 minutes.  However, the infant did not remain in a consistent sucking pattern and lots of stimulation needed to keep her sucking, but she had just fed 90 ml 3hrs prior (the largest amount she has fed).  Few swallows heard with compressions.  Pre & Post weights done. (Pre-feed weight - 3,235g; post weight - 3,245g)  Infant transferred 10 ml of breastmilk on first breast.    Due to the 38-80 ml range of feeds over the past 24 hrs in the NICU and infant is on day 4 of life, LC set up an SNS with 30 ml formula and taught mom how to use SNS at breast for feedings to maintain caloric intake and volume needed. Mom latched infant independently on second breast (left breast) in cross-cradle hold.  LC taught her how to latch with SNS and also taught her how to feed SNS into the infant's mouth after latching.  With SNS, infant sucked in a more consistent sucking pattern with minimal stimulation to keep in a sucking pattern.  Post feed weight= 3,265 g.  Infant transferred 20 ml milk (19 ml formula SNS and 1 ml breastmilk).  Total amount of breastmilk/formula intake at breast = 30 ml.  Mom nipple fed remaining 11 ml formula to infant using  paced-feeding method.    Total amount of feeding = 41 ml (30 formula + 11 breastmilk)  At first mom was unsure about SNS but then after some practice she verbalized that she felt good about using the SNS and felt like she could manage it at home at breast.  Other alternatives discussed such as using curved-tip syringe at breast for supplemental feeding (as mom has been doing) and bottle feeding using paced-feeding method.  Mom wants to be able to directly breastfeed infant at breast and minimize amounts of bottles and formula infant is receiving while increasing breastmilk feeding amounts.    Peds MD appointment mom stated is for Thursday, June 18 @ 10:30 am  Outpatient Lactation appointment for Monday, June 22 @ 1:00pm with [facility]; [facility] Appointment sheet given to patient.  Feeding Plan for Home 1.  Feed with feeding cues (at least 8-12 times per day) while skin-to-skin with infant. 2.  Latch infant to first breast without SNS and let infant feed; keep her sucking in a consistent pattern and listen for swallows 3.  Latch to second breast with 30-45 ml SNS (EBM if available or Formula if needed); keep infant sucking in a consistent pattern.  Increase formula amount as MD orders. 4.  Allow infant to determine the end of the feeding (when she is done eating) 5.  Infant should show feeding cues  at least every 2-3 hours or more often.  Feed with feeding cues.  Decrease formula amounts in SNS as mom's milk production begins to increase so that infant is hungry and breastfeeds 8-12 times per day.   6.  Pump with DEBP for 15-20 minutes after feeding during the day using hands-on pumping method and hand expression at end of pumping session.  Keep pumping log in NICU booklet.   7.  Continue STS as much as possible when mom is awake.     Feeding Feeding Type: Breast Fed Length of feed: 30 min  LATCH Score/Interventions Latch: Grasps breast easily, tongue down, lips flanged, rhythmical  sucking. Intervention(s): Adjust position;Breast compression  Audible Swallowing: A few with stimulation Intervention(s): Skin to skin  Type of Nipple: Flat (semi-flat but infant can easily latch)  Comfort (Breast/Nipple): Soft / non-tender     Hold (Positioning): Assistance needed to correctly position infant at breast and maintain latch.  LATCH Score: 7  Lactation Tools Discussed/Used     Consult Status Consult Status: PRN Follow-up type: In-patient    Lendon KaVann, Pamela Marsh 03/20/2014, 2:30 PM

## 2014-03-20 NOTE — Progress Notes (Signed)
Ur chart review completed per request.  

## 2014-03-20 NOTE — Discharge Summary (Signed)
Reviewed and agree with note and plan. V.Fraidy Mccarrick, MD  

## 2014-03-27 ENCOUNTER — Ambulatory Visit (HOSPITAL_COMMUNITY)
Admission: RE | Admit: 2014-03-27 | Discharge: 2014-03-27 | Disposition: A | Discharge status: Home / Self Care | Payer: 59 | Source: Ambulatory Visit | Attending: Obstetrics | Admitting: Obstetrics

## 2014-03-27 NOTE — Lactation Note (Signed)
Adult Lactation Consultation Outpatient Visit Note  Patient Name: Pamela LemmingsKimberly D Baquera                         "[name]" Date of Birth: 03-12-1976                                          10 days Gestational Age at Delivery: [redacted]w[redacted]d                         7[birth year], [id number] Type of Delivery:   Breastfeeding History: Frequency of Breastfeeding: 2 times daily  Length of Feeding: 5-15 mins Voids: QS Stools: QS  Supplementing / Method: Pumping:  Type of Pump:Medela PIS   Frequency: 3-4 times daily  Volume:    Comments: infant was sent to NICU due to Low one touch. Mother breastfed in the NICU as often as possible. She went home using a SNS. She states that it became too complicated and stopped using. Mother was also fit with a Nipple Shield while in the hospital. She states she has not used that nipple shield in last several days.     Consultation Evaluation:Mothers breast were very full. Mother was taught hand expression to soften areola. 5-7 ml of milk removed with hand expression. Infant latched on after several attempts. Infant sustained good depth for 22 mins. Mother taught breast compression.  Infant transferred 58 ml  Infant placed on alternate breast. Hand expressed another 5-6 ml piror to latch. Infant fed well. She transferred another 58 ml. Infant was very satisfied. Additional supplement was not offered.   Initial Feeding Assessment: Pre-feed [id number]:3464 Post-feed [id number]:6578Weight:3522 Amount Transferred:58 ml Comments:  Additional Feeding Assessment: Pre-feed Weight:3522 Post-feed Weight:3580 Amount Transferred:58 ml Comments:   Total Breast milk Transferred this Visit: 116 ml Total Supplement Given:   Additional Interventions: Advised mother to breastfeed on cue and wake infant if not cuing in 3 hours during the day Allow infant to feed on cue during the night Advised mother to hand express or to pre pump with electric pump for 2-3 mins to soften areola Allow infant to breastfeed  well on first breast, burp and offer the second breast Suggested she do good breast compression while infant is feeding  Mother to offer supplement of Expressed Breastmilk after each breastfeeding with a bottle Recommend to offer at least 20-30 ml or as much as infant will take  Suggested using a slow flow nipple with a wide base and to pace bottle feed Mother to continue to post pump at least 4 times daily for 20 mins.  Mother to follow up for weight check in one week   Follow-Up  June 29 at 2:30     Stevan BornKendrick, Sherry [facility] 03/27/2014, 3:23 PM

## 2014-04-03 ENCOUNTER — Ambulatory Visit (HOSPITAL_COMMUNITY)
Admission: RE | Admit: 2014-04-03 | Discharge: 2014-04-03 | Disposition: A | Discharge status: Home / Self Care | Payer: 59 | Source: Ambulatory Visit | Attending: Obstetrics | Admitting: Obstetrics

## 2014-04-03 NOTE — Lactation Note (Signed)
Lactation Consult  Mother's reason for visit:  1 week f/u Visit Type:  O/P Appointment Notes:  See below Consult:  Follow-Up Lactation Consultant:  Remigio Eisenmengerichey, Kimberely Hamilton  Mother's Name: Alvis LemmingsKimberly D Mondesir Type of delivery:  vag Breastfeeding Experience:  Primip (2 step-children) Maternal Medical Conditions:  Gestational diabetes mellitis, PIH, Infertility, Obesity, Depression Maternal Medications:  IB, PNV, labetalol 100mg   ________________________________________________________________________  Breastfeeding History (Post Discharge)  Frequency of breastfeeding: qid Duration of feeding: 15-20 min   Supplementation  Breastmilk:  Volume 120-140 ml Frequency: TID Total volume per day:  360-420 ml  Method:  Bottle, gets "topped off" after breastfeeds: 20-30 mL    Pumping  Type of pump:  Medela pump in style Frequency:  Tid-qid 20 mL Volume:  6.25-8+ oz/session  Infant Intake and Output Assessment  Voids:  Multiple in 24 hrs.  Color:  Clear yellow Stools:  Multiple in 24 hrs.  Color:  Yellow  ________________________________________________________________________  Maternal Breast Assessment  Breast:  Full Nipple:  Normal Pain level:  0 Pain interventions:  N/A  _______________________________________________________________________ Feeding Assessment/Evaluation  Initial feeding assessment: Positioning:  Cross cradle     Attached assessment:  Deep  Lips flanged:  Yes.    Lips untucked:  Yes.    Tools:  None Instructed on use and cleaning of tool:  N/A  Pre-feed weight: 3792g g Post-feed weight: 3844 g Amount transferred: 52 ml, R breast, 24 min   Pre-feed weight: 3844g Post-feed weight: 3892 g Amount transferred:48 ml, L breast, 10 min  Total amount transferred:  100  ml  Baby has gained 14 oz in 1 week.  Mom has been "topping off" baby w/ 20-30 mL after breastfeeds. When not fed at the breast, baby receives 120-12140mL of EBM via bottle.   Based  on excellent weight gain, Mom made aware that she no longer needs to "top off" baby.  Mom has the option of reducing/eliminating pumping & BO by adding sessions at the breast, if she desires.  I have no concerns.   Glenetta HewKim Richey, RN, IBCLC

## 2014-08-07 ENCOUNTER — Encounter (HOSPITAL_COMMUNITY): Payer: Self-pay | Admitting: *Deleted

## 2015-03-29 ENCOUNTER — Other Ambulatory Visit: Payer: Self-pay | Admitting: Obstetrics

## 2015-03-29 DIAGNOSIS — Z419 Encounter for procedure for purposes other than remedying health state, unspecified: Secondary | ICD-10-CM

## 2015-03-30 ENCOUNTER — Ambulatory Visit (HOSPITAL_COMMUNITY): Payer: 59

## 2015-03-30 ENCOUNTER — Ambulatory Visit (HOSPITAL_COMMUNITY): Payer: 59 | Admitting: Anesthesiology

## 2015-03-30 ENCOUNTER — Encounter (HOSPITAL_COMMUNITY)
Admission: RE | Disposition: A | Discharge status: Home / Self Care | Payer: Self-pay | Source: Ambulatory Visit | Attending: Obstetrics

## 2015-03-30 ENCOUNTER — Ambulatory Visit (HOSPITAL_COMMUNITY)
Admission: RE | Admit: 2015-03-30 | Discharge: 2015-03-30 | Disposition: A | Discharge status: Home / Self Care | Payer: 59 | Source: Ambulatory Visit | Attending: Obstetrics | Admitting: Obstetrics

## 2015-03-30 ENCOUNTER — Encounter (HOSPITAL_COMMUNITY): Payer: Self-pay | Admitting: Certified Registered Nurse Anesthetist

## 2015-03-30 DIAGNOSIS — G4730 Sleep apnea, unspecified: Secondary | ICD-10-CM | POA: Insufficient documentation

## 2015-03-30 DIAGNOSIS — N96 Recurrent pregnancy loss: Secondary | ICD-10-CM

## 2015-03-30 DIAGNOSIS — Z87891 Personal history of nicotine dependence: Secondary | ICD-10-CM | POA: Insufficient documentation

## 2015-03-30 DIAGNOSIS — Z6839 Body mass index (BMI) 39.0-39.9, adult: Secondary | ICD-10-CM | POA: Diagnosis not present

## 2015-03-30 DIAGNOSIS — Q512 Other doubling of uterus: Secondary | ICD-10-CM | POA: Insufficient documentation

## 2015-03-30 DIAGNOSIS — O034 Incomplete spontaneous abortion without complication: Secondary | ICD-10-CM | POA: Diagnosis present

## 2015-03-30 HISTORY — PX: OPERATIVE ULTRASOUND: SHX5996

## 2015-03-30 HISTORY — PX: DILATATION & CURRETTAGE/HYSTEROSCOPY WITH RESECTOCOPE: SHX5572

## 2015-03-30 LAB — CBC
HEMATOCRIT: 43.5 % (ref 36.0–46.0)
Hemoglobin: 15.2 g/dL — ABNORMAL HIGH (ref 12.0–15.0)
MCH: 31.8 pg (ref 26.0–34.0)
MCHC: 34.9 g/dL (ref 30.0–36.0)
MCV: 91 fL (ref 78.0–100.0)
Platelets: 238 10*3/uL (ref 150–400)
RBC: 4.78 MIL/uL (ref 3.87–5.11)
RDW: 13 % (ref 11.5–15.5)
WBC: 8.5 10*3/uL (ref 4.0–10.5)

## 2015-03-30 SURGERY — DILATATION & CURETTAGE/HYSTEROSCOPY WITH RESECTOCOPE
Anesthesia: General | Site: Vagina

## 2015-03-30 MED ORDER — PROPOFOL 10 MG/ML IV BOLUS
INTRAVENOUS | Status: DC | PRN
  Administered 2015-03-30: 200 mg via INTRAVENOUS

## 2015-03-30 MED ORDER — KETOROLAC TROMETHAMINE 30 MG/ML IJ SOLN
INTRAMUSCULAR | Status: AC
  Filled 2015-03-30: qty 1

## 2015-03-30 MED ORDER — BUPIVACAINE HCL 0.5 % IJ SOLN
INTRAMUSCULAR | Status: DC | PRN
  Administered 2015-03-30: 30 mL

## 2015-03-30 MED ORDER — ONDANSETRON HCL 4 MG/2ML IJ SOLN
INTRAMUSCULAR | Status: DC | PRN
  Administered 2015-03-30: 4 mg via INTRAVENOUS

## 2015-03-30 MED ORDER — LIDOCAINE HCL (CARDIAC) 20 MG/ML IV SOLN
INTRAVENOUS | Status: AC
Start: 2015-03-30 — End: 2015-03-30
  Filled 2015-03-30: qty 5

## 2015-03-30 MED ORDER — PROPOFOL 10 MG/ML IV BOLUS
INTRAVENOUS | Status: AC
  Filled 2015-03-30: qty 20

## 2015-03-30 MED ORDER — ONDANSETRON HCL 4 MG/2ML IJ SOLN
INTRAMUSCULAR | Status: AC
  Filled 2015-03-30: qty 2

## 2015-03-30 MED ORDER — LIDOCAINE HCL (CARDIAC) 20 MG/ML IV SOLN
INTRAVENOUS | Status: DC | PRN
  Administered 2015-03-30: 60 mg via INTRAVENOUS

## 2015-03-30 MED ORDER — FENTANYL CITRATE (PF) 100 MCG/2ML IJ SOLN
INTRAMUSCULAR | Status: AC
  Filled 2015-03-30: qty 2

## 2015-03-30 MED ORDER — METOCLOPRAMIDE HCL 5 MG/ML IJ SOLN
INTRAMUSCULAR | Status: AC
  Administered 2015-03-30: 10 mg via INTRAVENOUS
  Filled 2015-03-30: qty 2

## 2015-03-30 MED ORDER — DEXAMETHASONE SODIUM PHOSPHATE 10 MG/ML IJ SOLN
INTRAMUSCULAR | Status: DC | PRN
  Administered 2015-03-30: 4 mg via INTRAVENOUS

## 2015-03-30 MED ORDER — METOCLOPRAMIDE HCL 5 MG/ML IJ SOLN
10.0000 mg | Freq: Once | INTRAMUSCULAR | Status: AC
  Administered 2015-03-30: 10 mg via INTRAVENOUS

## 2015-03-30 MED ORDER — KETOROLAC TROMETHAMINE 30 MG/ML IJ SOLN
INTRAMUSCULAR | Status: DC | PRN
  Administered 2015-03-30: 30 mg via INTRAVENOUS

## 2015-03-30 MED ORDER — KETOROLAC TROMETHAMINE 30 MG/ML IJ SOLN: 30.0000 mg | Freq: Once | INTRAMUSCULAR | Status: DC | PRN

## 2015-03-30 MED ORDER — BUPIVACAINE HCL (PF) 0.5 % IJ SOLN
INTRAMUSCULAR | Status: AC
  Filled 2015-03-30: qty 30

## 2015-03-30 MED ORDER — DOXYCYCLINE HYCLATE 100 MG IV SOLR
100.0000 mg | Freq: Once | INTRAVENOUS | Status: AC
  Administered 2015-03-30: 100 mg via INTRAVENOUS
  Filled 2015-03-30: qty 100

## 2015-03-30 MED ORDER — MEPERIDINE HCL 25 MG/ML IJ SOLN: 6.2500 mg | INTRAMUSCULAR | Status: DC | PRN

## 2015-03-30 MED ORDER — SCOPOLAMINE 1 MG/3DAYS TD PT72
MEDICATED_PATCH | TRANSDERMAL | Status: AC
  Administered 2015-03-30: 1.5 mg via TRANSDERMAL
  Filled 2015-03-30: qty 1

## 2015-03-30 MED ORDER — SCOPOLAMINE 1 MG/3DAYS TD PT72
1.0000 | MEDICATED_PATCH | Freq: Once | TRANSDERMAL | Status: DC
  Administered 2015-03-30: 1.5 mg via TRANSDERMAL

## 2015-03-30 MED ORDER — MIDAZOLAM HCL 2 MG/2ML IJ SOLN
INTRAMUSCULAR | Status: DC | PRN
  Administered 2015-03-30: 2 mg via INTRAVENOUS

## 2015-03-30 MED ORDER — OXYCODONE-ACETAMINOPHEN 5-325 MG PO TABS: 2.0000 | ORAL_TABLET | Freq: Four times a day (QID) | ORAL | Status: DC | PRN

## 2015-03-30 MED ORDER — VASOPRESSIN 20 UNIT/ML IV SOLN
INTRAVENOUS | Status: AC
  Filled 2015-03-30: qty 1

## 2015-03-30 MED ORDER — SODIUM CHLORIDE 0.9 % IR SOLN
Status: DC | PRN
  Administered 2015-03-30: 1

## 2015-03-30 MED ORDER — LACTATED RINGERS IV SOLN
INTRAVENOUS | Status: DC
  Administered 2015-03-30 (×2): via INTRAVENOUS

## 2015-03-30 MED ORDER — FENTANYL CITRATE (PF) 100 MCG/2ML IJ SOLN
INTRAMUSCULAR | Status: DC | PRN
  Administered 2015-03-30 (×2): 12.5 ug via INTRAVENOUS
  Administered 2015-03-30 (×2): 50 ug via INTRAVENOUS
  Administered 2015-03-30 (×2): 12.5 ug via INTRAVENOUS

## 2015-03-30 MED ORDER — GLYCOPYRROLATE 0.2 MG/ML IJ SOLN
INTRAMUSCULAR | Status: AC
  Filled 2015-03-30: qty 1

## 2015-03-30 MED ORDER — CHLOROPROCAINE HCL 1 % IJ SOLN
INTRAMUSCULAR | Status: AC
  Filled 2015-03-30: qty 30

## 2015-03-30 MED ORDER — MIDAZOLAM HCL 2 MG/2ML IJ SOLN
INTRAMUSCULAR | Status: AC
  Filled 2015-03-30: qty 2

## 2015-03-30 MED ORDER — SILVER NITRATE-POT NITRATE 75-25 % EX MISC
CUTANEOUS | Status: AC
  Filled 2015-03-30: qty 1

## 2015-03-30 MED ORDER — DEXAMETHASONE SODIUM PHOSPHATE 4 MG/ML IJ SOLN
INTRAMUSCULAR | Status: AC
Start: 2015-03-30 — End: 2015-03-30
  Filled 2015-03-30: qty 1

## 2015-03-30 MED ORDER — ONDANSETRON HCL 4 MG/2ML IJ SOLN: 4.0000 mg | Freq: Once | INTRAMUSCULAR | Status: DC | PRN

## 2015-03-30 MED ORDER — FENTANYL CITRATE (PF) 100 MCG/2ML IJ SOLN: 25.0000 ug | INTRAMUSCULAR | Status: DC | PRN

## 2015-03-30 MED ORDER — GLYCOPYRROLATE 0.2 MG/ML IJ SOLN
INTRAMUSCULAR | Status: DC | PRN
  Administered 2015-03-30: 0.2 mg via INTRAVENOUS

## 2015-03-30 SURGICAL SUPPLY — 19 items
CANISTER SUCT 3000ML (MISCELLANEOUS) ×3 IMPLANT
CATH ROBINSON RED A/P 16FR (CATHETERS) ×3 IMPLANT
CLOTH BEACON ORANGE TIMEOUT ST (SAFETY) ×3 IMPLANT
CONTAINER PREFILL 10% NBF 60ML (FORM) ×6 IMPLANT
DEVICE MYOSURE CLASSIC (MISCELLANEOUS) ×1 IMPLANT
ELECT REM PT RETURN 9FT ADLT (ELECTROSURGICAL)
ELECTRODE REM PT RTRN 9FT ADLT (ELECTROSURGICAL) IMPLANT
GLOVE BIO SURGEON STRL SZ 6.5 (GLOVE) ×3 IMPLANT
GLOVE BIOGEL PI IND STRL 7.0 (GLOVE) ×2 IMPLANT
GLOVE BIOGEL PI INDICATOR 7.0 (GLOVE) ×1
GOWN STRL REUS W/TWL LRG LVL3 (GOWN DISPOSABLE) ×6 IMPLANT
LOOP ANGLED CUTTING 22FR (CUTTING LOOP) IMPLANT
PACK VAGINAL MINOR WOMEN LF (CUSTOM PROCEDURE TRAY) ×3 IMPLANT
PAD OB MATERNITY 4.3X12.25 (PERSONAL CARE ITEMS) ×3 IMPLANT
STENT BALLN UTERINE 4CM 6FR (STENTS) IMPLANT
TOWEL OR 17X24 6PK STRL BLUE (TOWEL DISPOSABLE) ×6 IMPLANT
TUBING AQUILEX INFLOW (TUBING) ×3 IMPLANT
TUBING AQUILEX OUTFLOW (TUBING) ×3 IMPLANT
WATER STERILE IRR 1000ML POUR (IV SOLUTION) ×3 IMPLANT

## 2015-03-30 NOTE — H&P (Signed)
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 Pt diagnosed with 8 wk MAB, initially treated w/ IPAS but unable to successfully cannulate L side of septate. Most of sac removed but 2 wks later persistent 1.8 cm mass w/ flow. Given methergine but finding remained. Now for u/s guided hysteroscopy w/ D&C.  Past Medical History  Diagnosis Date  . Mental disorder     hx - no meds  . Sleep apnea     no cpap - lost 25 lbs since study  . Pregnancy induced hypertension     on meds around 33-34wks  . Gestational diabetes     on glyburide-dly  . Infection     UTI  . Depression     hx of - years ago  . Compression fracture 2004    2 lumbar fractures. Healed with brace    Past Surgical History  Procedure Laterality Date  . Right knee surgery      acl  . Dilation and evacuation  07/27/2012    Procedure: DILATATION AND EVACUATION;  Surgeon: Alphonsus Sias. Ernestina Penna, MD;  Location: [facility];  Service: Gynecology;  Laterality: N/A;  with ultrasound  . Dilation and curettage of uterus     PE: Filed Vitals:   03/30/15 1211  BP: 117/74  Pulse: 82  Temp: 98.6 F (37 C)  Resp: 20   Gen: well appearing, no distress Abd: soft, NT, ND GU: def to OR LE: NT, no edema  O neg CBC    Component Value Date/Time   WBC 8.5 03/30/2015 1215   RBC 4.78 03/30/2015 1215   HGB 15.2* 03/30/2015 1215   HCT 43.5 03/30/2015 1215   PLT 238 03/30/2015 1215   MCV 91.0 03/30/2015 1215   MCH 31.8 03/30/2015 1215   MCHC 34.9 03/30/2015 1215   RDW 13.0 03/30/2015 1215     A/P: retained POCs, for u/s guided D&C w/ hysteroscopy given uterine abnormality. Pt aware risks of bleeding, damage to uterus.  Rh neg, s/p rhogam  Euphemia Lingerfelt A. 03/30/2015 2:01 PM

## 2015-03-30 NOTE — Op Note (Signed)
03/30/2015  5:14 PM  PATIENT:  Pamela Marsh  39 y.o. 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 would like myosure available.  OPERATIVE ULTRASOUND.  (N/A)  SURGEON:  Surgeon(s) and Role:    * Noland Fordyce, MD - Primary  PHYSICIAN ASSISTANT: none  ASSISTANTS: none   ANESTHESIA:   local and general  EBL:  Total I/O In: 750 [I.V.:750] Out: 205 [Urine:200; Blood:5]  BLOOD ADMINISTERED:none  DRAINS: none   LOCAL MEDICATIONS USED:  BUPIVICAINE   SPECIMEN:  Source of Specimen:  retained POCs,  DISPOSITION OF SPECIMEN:  pathology  COUNTS:  YES  TOURNIQUET:  * No tourniquets in log *  DICTATION: .Note written in EPIC  PLAN OF CARE: Discharge to home after PACU  PATIENT DISPOSITION:  PACU - hemodynamically stable.   Delay start of Pharmacological VTE agent (>24hrs) due to surgical blood loss or risk of bleeding: yes  Complications: None Antibodies: 100 mg IV doxycycline Findings: Thick uterine septum, right hemiuterus with smooth endometrial surface and normal-appearing ostia, left hemiuterus with thickened tissue throughout 1.5 cm mass on the lateral wall with soft appearance and blood clot within, unable to visualize left ostia, no excessive vascularity seen. No evidence of uterine perforation or free fluid post procedure, limited ultrasound views with no evidence of retained POC's and no excessive vascular flow to endometrium. Septate uterus difficult to discern by ultrasound.  Procedure: After informed consent was obtained and patient was taken to the operating room where general anesthesia was initiated without difficulty. She is prepped and  draped in the normal sterile fashion in the dorsal supine lithotomy position a straight catheter was done to drain her urine. 100 mg of IV doxycycline was given. Bimanual exam was done to assess the size and the position of the uterus. Ultrasound guidance was in the room. At this time it was noted that a straight catheter was done in the plan was to backfilled the bladder with saline and should that be needed for proper ultrasound visualization. A speculum was placed into the vagina single-tooth tenaculum used to grasp the anterior lip of the cervix and 20 cc of half percent Marcaine were infused at 12:00 into the cervix, 4 cc, the remainder split between 5 and 7:00 in the cervical part paracervical junction. Ultrasound guidance was then used to follow cervical dilation. Very limited visualization was noted. A regular catheter was used to backfilled the bladder 220 cc. Still ultrasound visualization was poor. A transvaginal ultrasound was attempted however we were still unable to identify the 2 endometrium's of the septate uterus. With the limited ultrasound visualization no endometrial thickening or flow was identified however this was thought due to poor ultrasound quality. Given the ultrasound findings just 24 hours ago I was suspicious that there was retained POC's remaining in the left hemiuterus, especially as the patient does not give any interval history of excessive bleeding. I then used the hysteroscope to visually inspect the uterus. Given her preprocedural Cytotec use dilation was very easy. Carefully under visual guidance with the hysteroscope the scope was placed into the cervix. A large thick uterine septum was noted to come just shy of the internal os. Cannulating the right hemiuterus showed a smooth endometrium without abnormalities and normal right ostia.  In contrast to prior attempts at cannulating the left hemiuterus this was easy to perform. The hysteroscope easily slid into the left hemiuterus  with fluffy endometrium in the lower part of the uterus and a 2 cm mass with blood clot within on the lateral side wall. Using ultrasound guidance to ensure no myometrial perforation the minor fissure device was used to remove the mass and thickened tissue along the left hemiuterus. No excessive vascularity was found. Ultrasound was used to ensure no perforation but was limited in its ability to ensure thinning of the endometrial stripe without blood flow. Once sufficient visual curettage of the left hemiuterus was done and hemostasis was assured the procedure was terminated. All incisions were removed from the vagina. Hemostasis was noted. Fluid deficit was calculated by the nursing staff.  Sponge lap and needle count were correct 3 and patient was taken to the recovery room in a stable condition  Pamela Marsh A. 03/30/2015 5:27 PM

## 2015-03-30 NOTE — Transfer of Care (Signed)
Immediate Anesthesia Transfer of Care Note  Patient: Pamela Marsh  Procedure(s) Performed: Procedure(s) with comments: DILATATION & CURETTAGE/ HYSTEROSCOPY WITH MYOSURE (N/A) - 1 hr. would like myosure available.  OPERATIVE ULTRASOUND.  (N/A)  Patient Location: PACU  Anesthesia Type:General  Level of Consciousness: awake, alert , oriented and patient cooperative  Airway & Oxygen Therapy: Patient Spontanous Breathing and Patient connected to nasal cannula oxygen  Post-op Assessment: Report given to RN and Post -op Vital signs reviewed and stable  Post vital signs: Reviewed and stable  Last Vitals:  Filed Vitals:   03/30/15 1211  BP: 117/74  Pulse: 82  Temp: 37 C  Resp: 20    Complications: No apparent anesthesia complications

## 2015-03-30 NOTE — Anesthesia Postprocedure Evaluation (Signed)
  Anesthesia Post-op Note  Patient: Pamela Marsh  Procedure(s) Performed: Procedure(s) with comments: DILATATION & CURETTAGE/ HYSTEROSCOPY WITH MYOSURE (N/A) - 1 hr. would like myosure available.  OPERATIVE ULTRASOUND.  (N/A)  Patient Location: PACU  Anesthesia Type:General  Level of Consciousness: awake and alert   Airway and Oxygen Therapy: Patient Spontanous Breathing  Post-op Pain: mild  Post-op Assessment: Post-op Vital signs reviewed              Post-op Vital Signs: Reviewed and stable  Last Vitals:  Filed Vitals:   03/30/15 1530  BP: 124/76  Pulse: 87  Temp:   Resp: 20    Complications: No apparent anesthesia complications

## 2015-03-30 NOTE — Anesthesia Preprocedure Evaluation (Addendum)
Anesthesia Evaluation  Patient identified by MRN, date of birth, ID band Patient awake    Reviewed: Allergy & Precautions, H&P , NPO status , Patient's Chart, lab work & pertinent test results, reviewed documented beta blocker date and time   Airway Mallampati: I  TM Distance: >3 FB Neck ROM: full    Dental no notable dental hx. (+) Teeth Intact   Pulmonary former smoker,  No CPAP   Pulmonary exam normal       Cardiovascular Normal cardiovascular exam    Neuro/Psych    GI/Hepatic negative GI ROS, Neg liver ROS,   Endo/Other  Morbid obesity  Renal/GU negative Renal ROS     Musculoskeletal   Abdominal Normal abdominal exam  (+)   Peds  Hematology negative hematology ROS (+)   Anesthesia Other Findings   Reproductive/Obstetrics negative OB ROS                            Anesthesia Physical Anesthesia Plan  ASA: III  Anesthesia Plan: General   Post-op Pain Management:    Induction: Intravenous  Airway Management Planned: LMA  Additional Equipment:   Intra-op Plan:   Post-operative Plan:   Informed Consent: I have reviewed the patients History and Physical, chart, labs and discussed the procedure including the risks, benefits and alternatives for the proposed anesthesia with the patient or authorized representative who has indicated his/her understanding and acceptance.     Plan Discussed with: CRNA and Surgeon  Anesthesia Plan Comments:        Anesthesia Quick Evaluation

## 2015-03-30 NOTE — Brief Op Note (Signed)
03/30/2015  5:14 PM  PATIENT:  Pamela Marsh  39 y.o. 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 would like myosure available.  OPERATIVE ULTRASOUND.  (N/A)  SURGEON:  Surgeon(s) and Role:    * Noland Fordyce, MD - Primary  PHYSICIAN ASSISTANT: none  ASSISTANTS: none   ANESTHESIA:   local and general  EBL:  Total I/O In: 750 [I.V.:750] Out: 205 [Urine:200; Blood:5]  BLOOD ADMINISTERED:none  DRAINS: none   LOCAL MEDICATIONS USED:  BUPIVICAINE   SPECIMEN:  Source of Specimen:  retained POCs,  DISPOSITION OF SPECIMEN:  pathology  COUNTS:  YES  TOURNIQUET:  * No tourniquets in log *  DICTATION: .Note written in EPIC  PLAN OF CARE: Discharge to home after PACU  PATIENT DISPOSITION:  PACU - hemodynamically stable.   Delay start of Pharmacological VTE agent (>24hrs) due to surgical blood loss or risk of bleeding: yes

## 2015-03-30 NOTE — Discharge Instructions (Signed)

## 2015-03-30 NOTE — Anesthesia Procedure Notes (Signed)
Procedure Name: LMA Insertion Date/Time: 03/30/2015 2:10 PM Performed by: Yolonda Kida Pre-anesthesia Checklist: Patient identified, Patient being monitored, Emergency Drugs available and Suction available Patient Re-evaluated:Patient Re-evaluated prior to inductionOxygen Delivery Method: Circle system utilized Preoxygenation: Pre-oxygenation with 100% oxygen Intubation Type: IV induction Ventilation: Mask ventilation without difficulty LMA: LMA inserted LMA Size: 4.0 Number of attempts: 1 Placement Confirmation: positive ETCO2,  CO2 detector and breath sounds checked- equal and bilateral Tube secured with: Tape Dental Injury: Teeth and Oropharynx as per pre-operative assessment

## 2015-04-03 ENCOUNTER — Encounter (HOSPITAL_COMMUNITY): Payer: Self-pay | Admitting: Obstetrics

## 2018-02-02 DIAGNOSIS — M9903 Segmental and somatic dysfunction of lumbar region: Secondary | ICD-10-CM | POA: Diagnosis not present

## 2018-02-02 DIAGNOSIS — M9902 Segmental and somatic dysfunction of thoracic region: Secondary | ICD-10-CM | POA: Diagnosis not present

## 2018-02-02 DIAGNOSIS — M9901 Segmental and somatic dysfunction of cervical region: Secondary | ICD-10-CM | POA: Diagnosis not present

## 2018-02-05 DIAGNOSIS — M9902 Segmental and somatic dysfunction of thoracic region: Secondary | ICD-10-CM | POA: Diagnosis not present

## 2018-02-05 DIAGNOSIS — M9901 Segmental and somatic dysfunction of cervical region: Secondary | ICD-10-CM | POA: Diagnosis not present

## 2018-02-05 DIAGNOSIS — M9903 Segmental and somatic dysfunction of lumbar region: Secondary | ICD-10-CM | POA: Diagnosis not present

## 2018-02-08 DIAGNOSIS — M9901 Segmental and somatic dysfunction of cervical region: Secondary | ICD-10-CM | POA: Diagnosis not present

## 2018-02-08 DIAGNOSIS — M9902 Segmental and somatic dysfunction of thoracic region: Secondary | ICD-10-CM | POA: Diagnosis not present

## 2018-02-08 DIAGNOSIS — M9903 Segmental and somatic dysfunction of lumbar region: Secondary | ICD-10-CM | POA: Diagnosis not present

## 2018-02-12 DIAGNOSIS — M9901 Segmental and somatic dysfunction of cervical region: Secondary | ICD-10-CM | POA: Diagnosis not present

## 2018-02-12 DIAGNOSIS — M9903 Segmental and somatic dysfunction of lumbar region: Secondary | ICD-10-CM | POA: Diagnosis not present

## 2018-02-12 DIAGNOSIS — M9902 Segmental and somatic dysfunction of thoracic region: Secondary | ICD-10-CM | POA: Diagnosis not present

## 2018-02-19 DIAGNOSIS — M9901 Segmental and somatic dysfunction of cervical region: Secondary | ICD-10-CM | POA: Diagnosis not present

## 2018-02-19 DIAGNOSIS — M9902 Segmental and somatic dysfunction of thoracic region: Secondary | ICD-10-CM | POA: Diagnosis not present

## 2018-02-19 DIAGNOSIS — M9903 Segmental and somatic dysfunction of lumbar region: Secondary | ICD-10-CM | POA: Diagnosis not present

## 2018-03-03 DIAGNOSIS — Z1231 Encounter for screening mammogram for malignant neoplasm of breast: Secondary | ICD-10-CM | POA: Diagnosis not present

## 2018-03-03 DIAGNOSIS — Z01419 Encounter for gynecological examination (general) (routine) without abnormal findings: Secondary | ICD-10-CM | POA: Diagnosis not present

## 2018-03-03 DIAGNOSIS — Z6829 Body mass index (BMI) 29.0-29.9, adult: Secondary | ICD-10-CM | POA: Diagnosis not present

## 2018-03-03 DIAGNOSIS — Z13220 Encounter for screening for lipoid disorders: Secondary | ICD-10-CM | POA: Diagnosis not present

## 2018-03-03 DIAGNOSIS — Z0000 Encounter for general adult medical examination without abnormal findings: Secondary | ICD-10-CM | POA: Diagnosis not present

## 2018-03-03 DIAGNOSIS — Z131 Encounter for screening for diabetes mellitus: Secondary | ICD-10-CM | POA: Diagnosis not present

## 2018-03-08 DIAGNOSIS — R110 Nausea: Secondary | ICD-10-CM | POA: Diagnosis not present

## 2018-03-08 DIAGNOSIS — R070 Pain in throat: Secondary | ICD-10-CM | POA: Diagnosis not present

## 2018-03-08 DIAGNOSIS — B084 Enteroviral vesicular stomatitis with exanthem: Secondary | ICD-10-CM | POA: Diagnosis not present

## 2018-07-26 DIAGNOSIS — R1011 Right upper quadrant pain: Secondary | ICD-10-CM | POA: Diagnosis not present

## 2018-07-27 ENCOUNTER — Ambulatory Visit (HOSPITAL_COMMUNITY)
Admission: RE | Admit: 2018-07-27 | Discharge: 2018-07-27 | Disposition: A | Discharge status: Home / Self Care | Payer: 59 | Source: Ambulatory Visit | Attending: Internal Medicine | Admitting: Internal Medicine

## 2018-07-27 ENCOUNTER — Other Ambulatory Visit (HOSPITAL_COMMUNITY): Payer: Self-pay | Admitting: Internal Medicine

## 2018-07-27 DIAGNOSIS — R110 Nausea: Secondary | ICD-10-CM

## 2018-07-27 DIAGNOSIS — R509 Fever, unspecified: Secondary | ICD-10-CM

## 2018-07-27 DIAGNOSIS — Z3490 Encounter for supervision of normal pregnancy, unspecified, unspecified trimester: Secondary | ICD-10-CM | POA: Diagnosis not present

## 2018-07-27 DIAGNOSIS — K8010 Calculus of gallbladder with chronic cholecystitis without obstruction: Secondary | ICD-10-CM

## 2018-07-27 DIAGNOSIS — R1013 Epigastric pain: Secondary | ICD-10-CM | POA: Diagnosis not present

## 2018-07-27 DIAGNOSIS — R1011 Right upper quadrant pain: Secondary | ICD-10-CM | POA: Diagnosis not present

## 2018-07-27 DIAGNOSIS — Z3201 Encounter for pregnancy test, result positive: Secondary | ICD-10-CM | POA: Diagnosis not present

## 2018-07-29 DIAGNOSIS — Z3201 Encounter for pregnancy test, result positive: Secondary | ICD-10-CM | POA: Diagnosis not present

## 2018-08-12 DIAGNOSIS — Z3A01 Less than 8 weeks gestation of pregnancy: Secondary | ICD-10-CM | POA: Diagnosis not present

## 2018-08-12 DIAGNOSIS — O09291 Supervision of pregnancy with other poor reproductive or obstetric history, first trimester: Secondary | ICD-10-CM | POA: Diagnosis not present

## 2018-08-17 DIAGNOSIS — O208 Other hemorrhage in early pregnancy: Secondary | ICD-10-CM | POA: Diagnosis not present

## 2018-08-17 DIAGNOSIS — O360110 Maternal care for anti-D [Rh] antibodies, first trimester, not applicable or unspecified: Secondary | ICD-10-CM | POA: Diagnosis not present

## 2018-08-17 DIAGNOSIS — Z3A01 Less than 8 weeks gestation of pregnancy: Secondary | ICD-10-CM | POA: Diagnosis not present

## 2018-09-06 DIAGNOSIS — O09521 Supervision of elderly multigravida, first trimester: Secondary | ICD-10-CM | POA: Diagnosis not present

## 2018-09-06 DIAGNOSIS — Z3480 Encounter for supervision of other normal pregnancy, unspecified trimester: Secondary | ICD-10-CM | POA: Diagnosis not present

## 2018-09-15 DIAGNOSIS — Z3A11 11 weeks gestation of pregnancy: Secondary | ICD-10-CM | POA: Diagnosis not present

## 2018-09-15 DIAGNOSIS — O99810 Abnormal glucose complicating pregnancy: Secondary | ICD-10-CM | POA: Diagnosis not present

## 2018-09-21 DIAGNOSIS — O021 Missed abortion: Secondary | ICD-10-CM | POA: Diagnosis not present

## 2018-09-23 DIAGNOSIS — Z6791 Unspecified blood type, Rh negative: Secondary | ICD-10-CM | POA: Diagnosis not present

## 2018-09-23 DIAGNOSIS — O021 Missed abortion: Secondary | ICD-10-CM | POA: Diagnosis not present

## 2018-10-14 DIAGNOSIS — Z3009 Encounter for other general counseling and advice on contraception: Secondary | ICD-10-CM | POA: Diagnosis not present

## 2018-10-14 DIAGNOSIS — O021 Missed abortion: Secondary | ICD-10-CM | POA: Diagnosis not present

## 2018-11-09 DIAGNOSIS — Z30430 Encounter for insertion of intrauterine contraceptive device: Secondary | ICD-10-CM | POA: Diagnosis not present

## 2018-11-19 DIAGNOSIS — M25511 Pain in right shoulder: Secondary | ICD-10-CM | POA: Diagnosis not present

## 2018-11-19 DIAGNOSIS — M25662 Stiffness of left knee, not elsewhere classified: Secondary | ICD-10-CM | POA: Diagnosis not present

## 2018-11-19 DIAGNOSIS — M79673 Pain in unspecified foot: Secondary | ICD-10-CM | POA: Diagnosis not present

## 2018-12-02 DIAGNOSIS — M2021 Hallux rigidus, right foot: Secondary | ICD-10-CM | POA: Diagnosis not present

## 2018-12-17 DIAGNOSIS — M79671 Pain in right foot: Secondary | ICD-10-CM | POA: Diagnosis not present

## 2020-10-13 ENCOUNTER — Other Ambulatory Visit: Payer: Self-pay

## 2020-10-13 DIAGNOSIS — Z20822 Contact with and (suspected) exposure to covid-19: Secondary | ICD-10-CM

## 2020-10-16 LAB — NOVEL CORONAVIRUS, NAA: SARS-CoV-2, NAA: NOT DETECTED

## 2020-11-15 ENCOUNTER — Other Ambulatory Visit: Payer: Self-pay | Admitting: Family Medicine

## 2021-03-16 ENCOUNTER — Emergency Department (HOSPITAL_COMMUNITY): Payer: No Typology Code available for payment source

## 2021-03-16 ENCOUNTER — Other Ambulatory Visit: Payer: Self-pay

## 2021-03-16 ENCOUNTER — Emergency Department (HOSPITAL_COMMUNITY)
Admission: EM | Admit: 2021-03-16 | Discharge: 2021-03-16 | Disposition: A | Discharge status: Home / Self Care | Payer: No Typology Code available for payment source | Attending: Emergency Medicine | Admitting: Emergency Medicine

## 2021-03-16 DIAGNOSIS — R200 Anesthesia of skin: Secondary | ICD-10-CM | POA: Diagnosis not present

## 2021-03-16 DIAGNOSIS — Z87891 Personal history of nicotine dependence: Secondary | ICD-10-CM | POA: Diagnosis not present

## 2021-03-16 DIAGNOSIS — D72829 Elevated white blood cell count, unspecified: Secondary | ICD-10-CM | POA: Diagnosis not present

## 2021-03-16 LAB — CBC WITH DIFFERENTIAL/PLATELET
Abs Immature Granulocytes: 0.04 10*3/uL (ref 0.00–0.07)
Basophils Absolute: 0.1 10*3/uL (ref 0.0–0.1)
Basophils Relative: 1 %
Eosinophils Absolute: 0.2 10*3/uL (ref 0.0–0.5)
Eosinophils Relative: 2 %
HCT: 44 % (ref 36.0–46.0)
Hemoglobin: 15 g/dL (ref 12.0–15.0)
Immature Granulocytes: 0 %
Lymphocytes Relative: 32 %
Lymphs Abs: 3.4 10*3/uL (ref 0.7–4.0)
MCH: 32.3 pg (ref 26.0–34.0)
MCHC: 34.1 g/dL (ref 30.0–36.0)
MCV: 94.6 fL (ref 80.0–100.0)
Monocytes Absolute: 0.8 10*3/uL (ref 0.1–1.0)
Monocytes Relative: 7 %
Neutro Abs: 6.2 10*3/uL (ref 1.7–7.7)
Neutrophils Relative %: 58 %
Platelets: 280 10*3/uL (ref 150–400)
RBC: 4.65 MIL/uL (ref 3.87–5.11)
RDW: 12.5 % (ref 11.5–15.5)
WBC: 10.7 10*3/uL — ABNORMAL HIGH (ref 4.0–10.5)
nRBC: 0 % (ref 0.0–0.2)

## 2021-03-16 LAB — COMPREHENSIVE METABOLIC PANEL
ALT: 16 U/L (ref 0–44)
AST: 22 U/L (ref 15–41)
Albumin: 3.9 g/dL (ref 3.5–5.0)
Alkaline Phosphatase: 53 U/L (ref 38–126)
Anion gap: 7 (ref 5–15)
BUN: 17 mg/dL (ref 6–20)
CO2: 25 mmol/L (ref 22–32)
Calcium: 9.8 mg/dL (ref 8.9–10.3)
Chloride: 105 mmol/L (ref 98–111)
Creatinine, Ser: 0.83 mg/dL (ref 0.44–1.00)
GFR, Estimated: >60 mL/min (ref >60)
Glucose, Bld: 89 mg/dL (ref 70–99)
Potassium: 4 mmol/L (ref 3.5–5.1)
Sodium: 137 mmol/L (ref 135–145)
Total Bilirubin: 0.4 mg/dL (ref 0.3–1.2)
Total Protein: 6.9 g/dL (ref 6.5–8.1)

## 2021-03-16 LAB — PROTIME-INR
INR: 0.9 (ref 0.8–1.2)
Prothrombin Time: 12.1 seconds (ref 11.4–15.2)

## 2021-03-16 LAB — I-STAT BETA HCG BLOOD, ED (MC, WL, AP ONLY): I-stat hCG, quantitative: <5 m[IU]/mL (ref <5)

## 2021-03-16 LAB — HCG, QUANTITATIVE, PREGNANCY: hCG, Beta Chain, Quant, S: <1 m[IU]/mL (ref <5)

## 2021-03-16 IMAGING — CT CT HEAD W/O CM
4 series · 17 of 47 positions shown, 19 images · non-contrast
Comparison: None.

CLINICAL DATA: RIGHT-sided numbness

EXAM:
CT HEAD WITHOUT CONTRAST
TECHNIQUE: Contiguous axial images were obtained from the base of the skull
through the vertex without intravenous contrast.

[Series 3: head wo · axial · 0.42mm/px · z∈[-82,+38]mm · 7 of 32 slices shown, 9 images]
[im 4/32  brain]
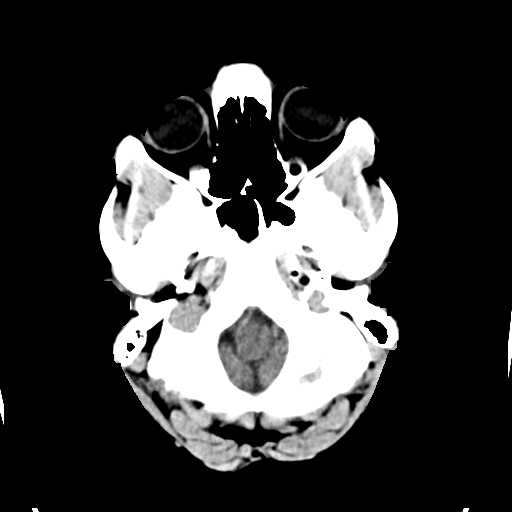
[im 4/32  bone]
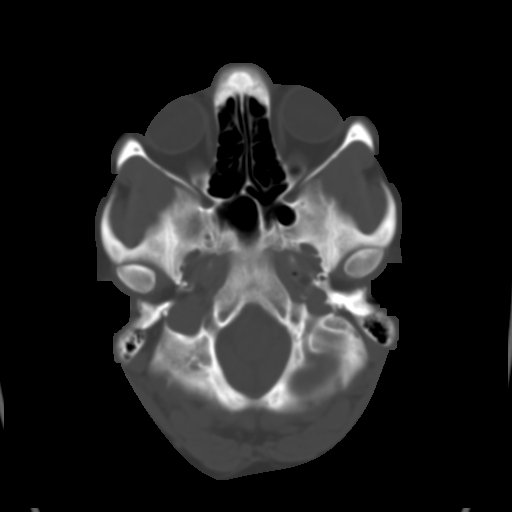
[im 8/32  brain]
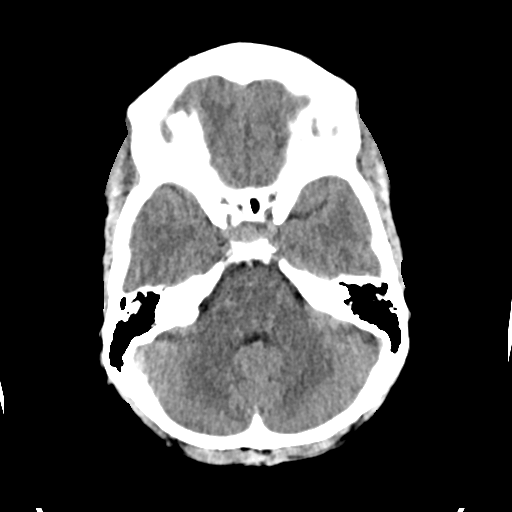
[im 12/32  brain]
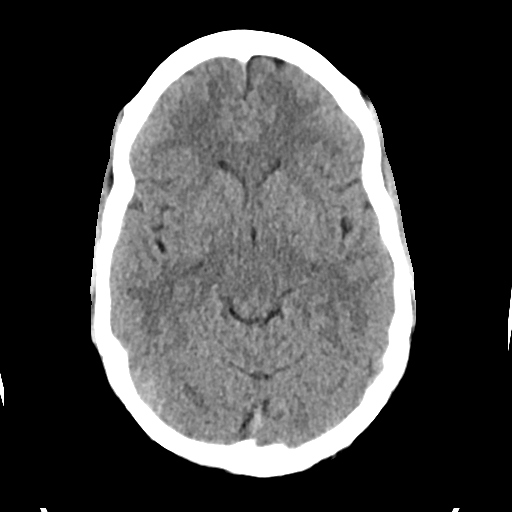
[im 16/32  brain]
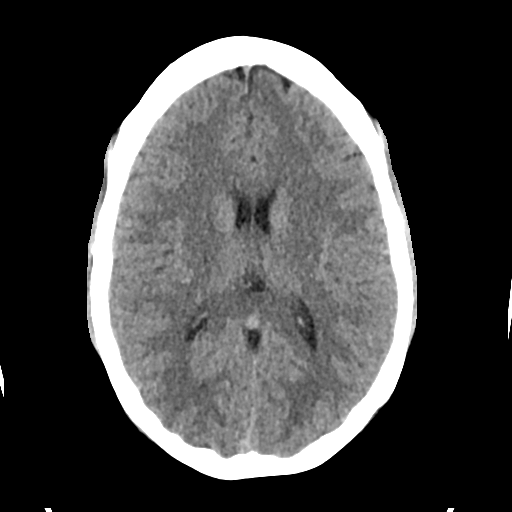
[im 20/32  brain]
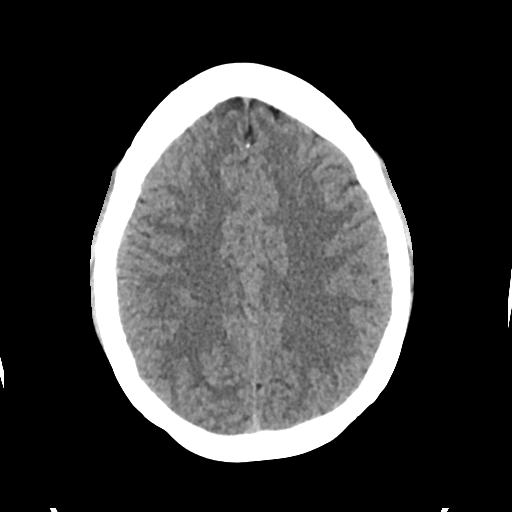
[im 20/32  bone]
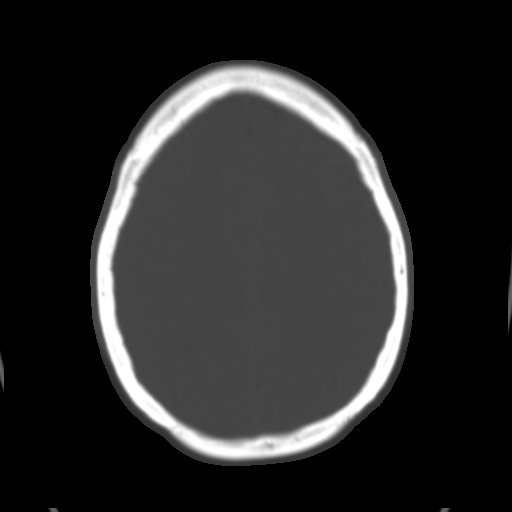
[im 24/32  brain]
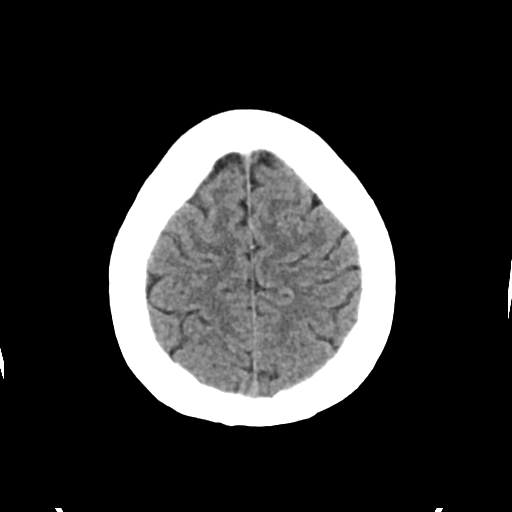
[im 28/32  brain]
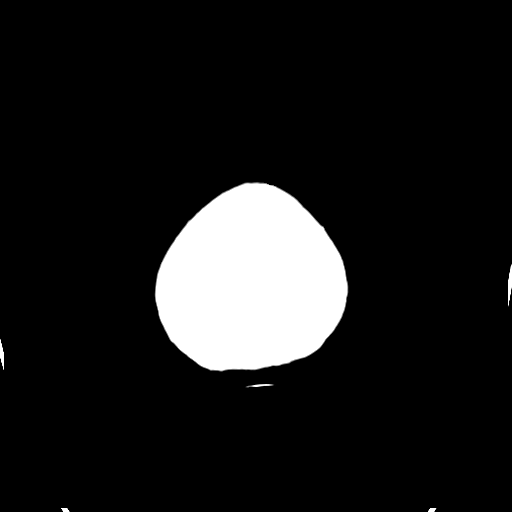

[Series 4: head bone · axial · 0.42mm/px · z∈[-83,-27]mm · 4 of 79 slices shown]
[im 8/79  bone]
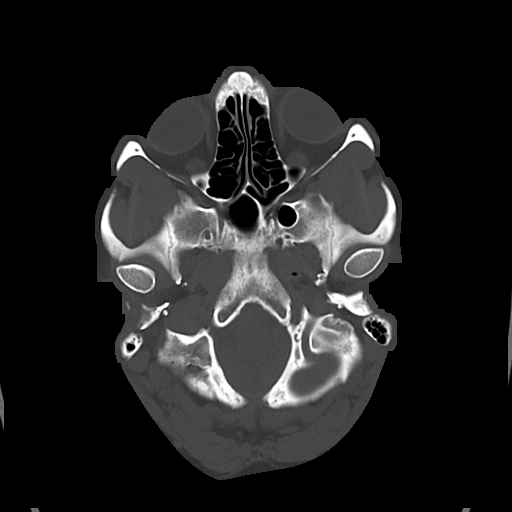
[im 16/79  bone]
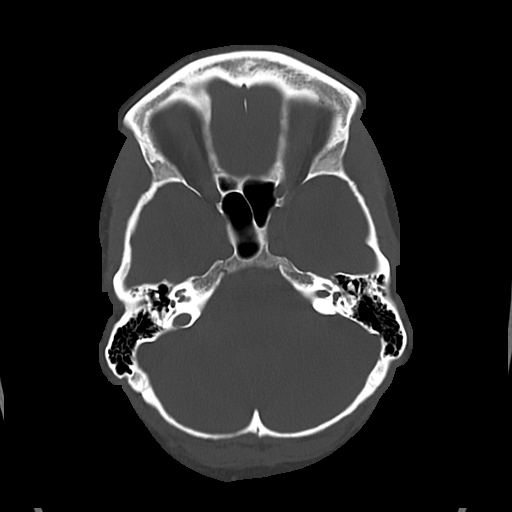
[im 24/79  bone]
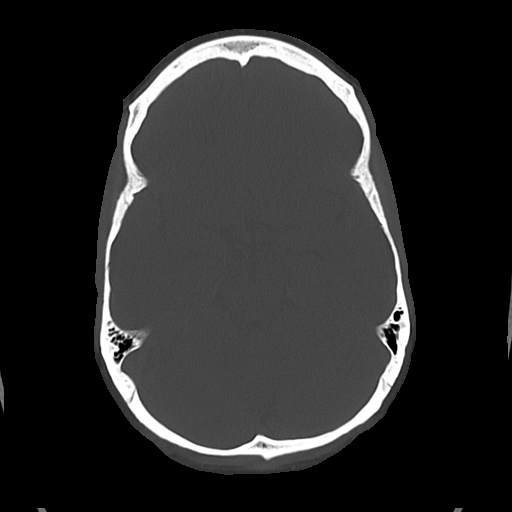
[im 36/79  bone]
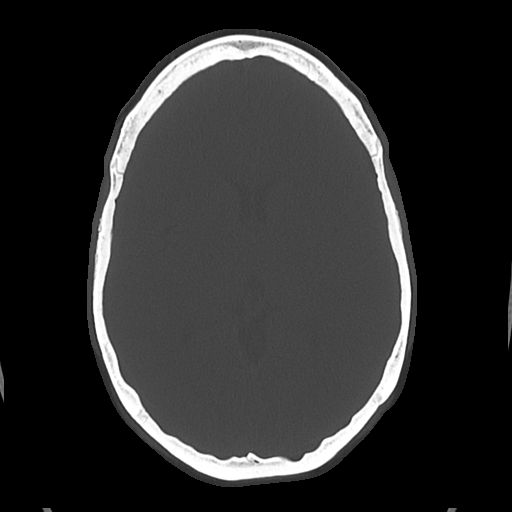

[Series 5: cor soft · coronal · 0.35mm/px · 3 of 70 slices shown]
[im 24/70  brain]
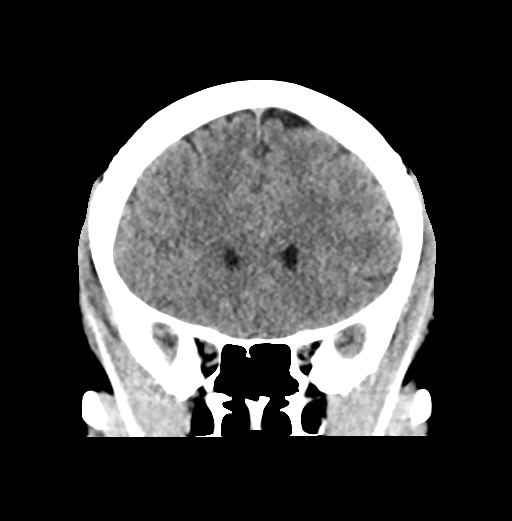
[im 31/70  brain]
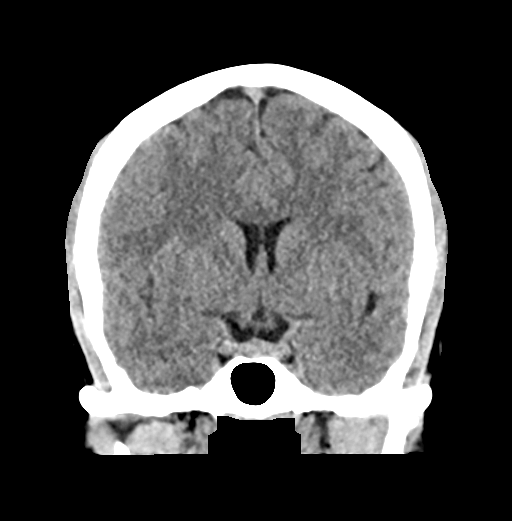
[im 39/70  brain]
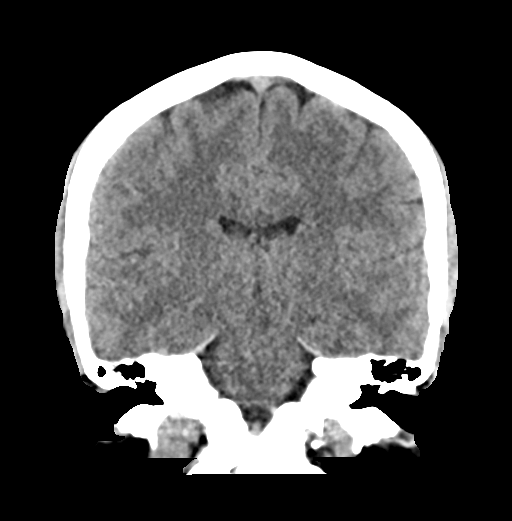

[Series 6: sag soft · sagittal · 0.35mm/px · 3 of 60 slices shown]
[im 20/60  brain]
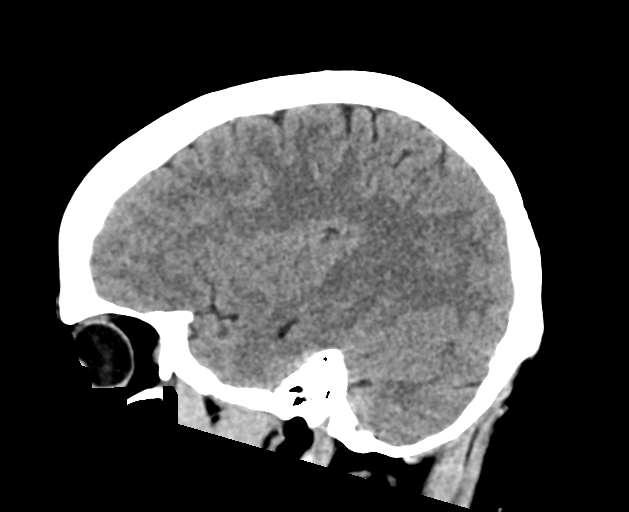
[im 30/60  brain]
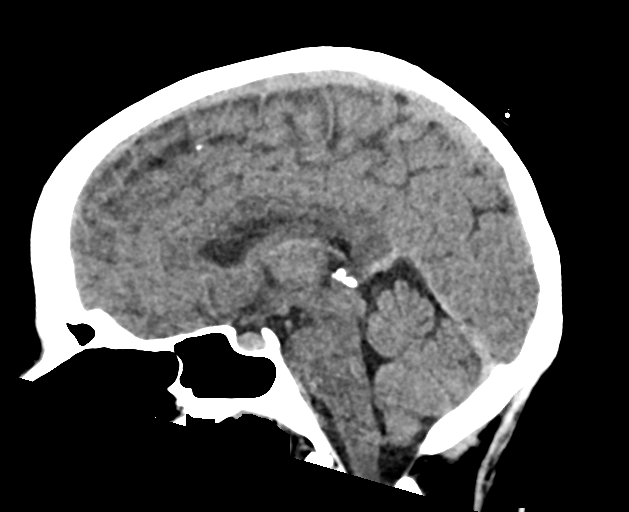
[im 40/60  brain]
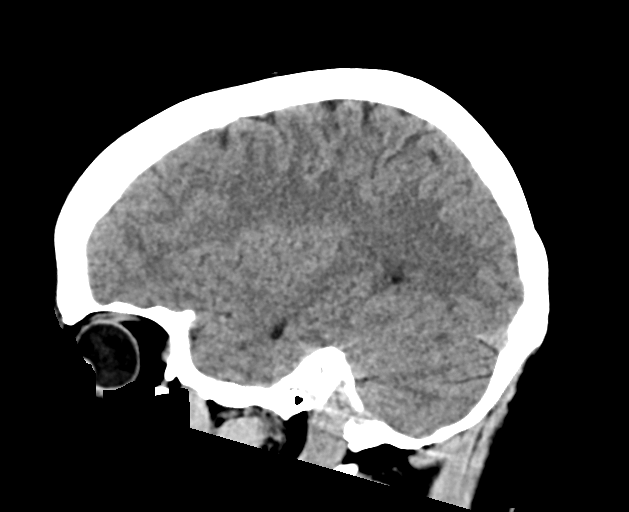

[17 of 47 positions shown; findings below may reference images not displayed]

FINDINGS: Brain: No evidence of acute infarction, hemorrhage, hydrocephalus,
extra-axial collection or mass lesion/mass effect.

Vascular: No hyperdense vessel or unexpected calcification.

Skull: Normal. Negative for fracture or focal lesion.

Sinuses/Orbits: No acute finding.

Other: None.
IMPRESSION: No acute intracranial abnormality.

## 2021-03-16 MED ORDER — GADOBUTROL 1 MMOL/ML IV SOLN
10.0000 mL | Freq: Once | INTRAVENOUS | Status: AC | PRN
  Administered 2021-03-16: 10 mL via INTRAVENOUS

## 2021-03-16 NOTE — ED Triage Notes (Signed)
Pt reports R thigh numbness x 1 week. Today developed numbness in R arm and face. Reports her R leg gave out a couple of times yesterday and feels overall week. Pain to R hip, but denies injury.

## 2021-03-16 NOTE — ED Provider Notes (Signed)
[facility] Provider Note   CSN: [id number] Arrival date & time: 03/16/21  1624     History Chief Complaint  Patient presents with   Numbness    Pamela Marsh is a 45 y.o. female who presents for evaluation of numbness.  She reports initially 1 week ago, she started having numbness just to the right anterior thigh.  She states that she does not feel like she can feel anything on her thigh.  She states that she has some decrease sensation that extends over the knee into the anterior aspect of the tib-fib but that she can still feel.  She states it does not extend to the posterior thigh.  No weakness.  She has been able to ambulate without any difficulty.  No preceding trauma, injury, fall.  No overlying warmth, erythema.  No insect, tick bites.  She states that today, she is felt like her right arm was getting that way.  She states that she feels like the entire posterior aspect of the right arm feels like she has decreased sensation that extends up to the shoulder.  She also felt like the V2 and V3 distribution of her right-sided face was getting numb.  She states it feels like "when you are on Novocain."  No weakness.  No fevers.  She denies any speech difficulty, facial droop.  No chest pain, difficulty breathing, abdominal pain, nausea/vomiting.  No urinary or bowel incontinence, no saddle anesthesia.  No family history of brain cancer, MS.  She does report a grandfather has ALS.  The history is provided by the patient.      Past Medical History:  Diagnosis Date   Compression fracture 2004   2 lumbar fractures. Healed with brace   Depression    hx of - years ago   Gestational diabetes    on glyburide-dly   Infection    UTI   Mental disorder    hx - no meds   Pregnancy induced hypertension    on meds around 33-34wks   Sleep apnea    no cpap - lost 25 lbs since study    Patient Active Problem List   Diagnosis Date Noted   Postpartum  care following vaginal delivery (6/12) 03/17/2014   Preeclampsia 03/15/2014    Past Surgical History:  Procedure Laterality Date   DILATATION & CURRETTAGE/HYSTEROSCOPY WITH RESECTOCOPE N/A 03/30/2015   Procedure: DILATATION & CURETTAGE/ HYSTEROSCOPY WITH MYOSURE;  Surgeon: Noland FordyceKelly Fogleman, MD;  Location: [facility];  Service: Gynecology;  Laterality: N/A;  1 hr. would like myosure available.   DILATION AND CURETTAGE OF UTERUS     DILATION AND EVACUATION  07/27/2012   Procedure: DILATATION AND EVACUATION;  Surgeon: Alphonsus SiasKelly A. Ernestina PennaFogleman, MD;  Location: [facility];  Service: Gynecology;  Laterality: N/A;  with ultrasound   OPERATIVE ULTRASOUND N/A 03/30/2015   Procedure:  OPERATIVE ULTRASOUND. ;  Surgeon: Noland FordyceKelly Fogleman, MD;  Location: [facility];  Service: Gynecology;  Laterality: N/A;   right knee surgery     acl     OB History     Gravida  2   Para  1   Term  1   Preterm      AB  1   Living  1      SAB  1   IAB      Ectopic      Multiple      Live Births  1  Family History  Problem Relation Age of Onset   Hypertension Father    Hyperlipidemia Father    Diabetes Father    Obesity Father    Sleep apnea Father    Cancer Maternal Aunt    Diabetes Paternal Grandmother     Social History   Tobacco Use   Smoking status: Former    Packs/day: 0.50    Years: 4.00    Pack years: 2.00    Types: Cigarettes    Quit date: 01/26/2000    Years since quitting: 21.1   Smokeless tobacco: Never  Substance Use Topics   Alcohol use: Yes    Comment: socially   Drug use: No    Home Medications Prior to Admission medications   Medication Sig Start Date End Date Taking? Authorizing Provider  Omega-3 Fatty Acids (FISH OIL PO) Take 1 capsule by mouth daily.   Yes [provider]  TURMERIC PO Take 1 tablet by mouth daily.   Yes [provider]    Allergies    Hydrocodone  Review of Systems   Review of Systems  Constitutional:  Negative for fever.   Respiratory:  Negative for cough and shortness of breath.   Cardiovascular:  Negative for chest pain.  Gastrointestinal:  Negative for abdominal pain, nausea and vomiting.  Genitourinary:  Negative for dysuria and hematuria.  Neurological:  Positive for numbness. Negative for facial asymmetry, speech difficulty, weakness and headaches.  All other systems reviewed and are negative.  Physical Exam Updated Vital Signs BP 117/80   Pulse 75   Temp 98.6 F (37 C) (Oral)   Resp 18   SpO2 100%   Physical Exam Vitals and nursing note reviewed.  Constitutional:      Appearance: Normal appearance. She is well-developed.  HENT:     Head: Normocephalic and atraumatic.  Eyes:     General: Lids are normal.     Conjunctiva/sclera: Conjunctivae normal.     Pupils: Pupils are equal, round, and reactive to light.  Cardiovascular:     Rate and Rhythm: Normal rate and regular rhythm.     Pulses: Normal pulses.          Radial pulses are 2+ on the right side and 2+ on the left side.       Dorsalis pedis pulses are 2+ on the right side and 2+ on the left side.     Heart sounds: Normal heart sounds. No murmur heard.   No friction rub. No gallop.  Pulmonary:     Effort: Pulmonary effort is normal.     Breath sounds: Normal breath sounds.     Comments: Lungs clear to auscultation bilaterally.  Symmetric chest rise.  No wheezing, rales, rhonchi. Abdominal:     Palpations: Abdomen is soft. Abdomen is not rigid.     Tenderness: There is no abdominal tenderness. There is no guarding.     Comments: Abdomen is soft, non-distended, non-tender. No rigidity, No guarding. No peritoneal signs.  Musculoskeletal:        General: Normal range of motion.     Cervical back: Full passive range of motion without pain.  Skin:    General: Skin is warm and dry.     Capillary Refill: Capillary refill takes less than 2 seconds.  Neurological:     Mental Status: She is alert and oriented to person, place, and time.      Deep Tendon Reflexes:     Reflex Scores:  Patellar reflexes are 1+ on the right side and 2+ on the left side.    Comments: Cranial nerves III-XII intact Follows commands, Moves all extremities  5/5 strength to BUE and BLE  She reports decreased sensation noted to the right anterior thigh that extends down to the knee.  From the knee to the mid tib-fib, she reports decreased sensation but states she can feel.  She reports decreased sensation of the posterior aspect of right upper extremity into the V2 and V3 distribution.  She can identify sharp and dull almost on every major nerve distribution.  She does miss a 1 or 2 on the right upper thigh and right lower leg.   Normal finger to nose. No dysdiadochokinesia. No pronator drift. No gait abnormalities  No slurred speech. No facial droop.   Psychiatric:        Speech: Speech normal.    ED Results / Procedures / Treatments   Labs (all labs ordered are listed, but only abnormal results are displayed) Labs Reviewed  CBC WITH DIFFERENTIAL/PLATELET - Abnormal; Notable for the following components:      Result Value   WBC 10.7 (*)    All other components within normal limits  COMPREHENSIVE METABOLIC PANEL  PROTIME-INR  HCG, QUANTITATIVE, PREGNANCY  I-STAT BETA HCG BLOOD, ED ([facility], [facility], [facility] ONLY)    EKG EKG Interpretation  Date/Time:  Saturday March 16 2021 19:56:52 EDT Ventricular Rate:  76 PR Interval:  169 QRS Duration: 92 QT Interval:  385 QTC Calculation: 433 R Axis:   70 Text Interpretation: Sinus rhythm ST elev, probable normal early repol pattern No old tracing to compare Confirmed by Jacalyn Lefevre [phone] on 03/16/2021 7:59:22 PM  Radiology CT Head Wo Contrast  Result Date: 03/16/2021 CLINICAL DATA:  RIGHT-sided numbness EXAM: CT HEAD WITHOUT CONTRAST TECHNIQUE: Contiguous axial images were obtained from the base of the skull through the vertex without intravenous contrast. COMPARISON:  None. FINDINGS: Brain: No  evidence of acute infarction, hemorrhage, hydrocephalus, extra-axial collection or mass lesion/mass effect. Vascular: No hyperdense vessel or unexpected calcification. Skull: Normal. Negative for fracture or focal lesion. Sinuses/Orbits: No acute finding. Other: None. IMPRESSION: No acute intracranial abnormality. Electronically Signed   By: Meda Klinefelter MD   On: 03/16/2021 18:04   MR Brain W and Wo Contrast  Result Date: 03/16/2021 CLINICAL DATA:  Initial evaluation for neuro deficit, stroke suspected, numbness. EXAM: MRI HEAD WITHOUT AND WITH CONTRAST MRI CERVICAL SPINE WITHOUT AND WITH CONTRAST TECHNIQUE: Multiplanar, multiecho pulse sequences of the brain and surrounding structures, and cervical spine, to include the craniocervical junction and cervicothoracic junction, were obtained without and with intravenous contrast. CONTRAST:  33mL GADAVIST GADOBUTROL 1 MMOL/ML IV SOLN COMPARISON:  Prior head CT from earlier the same day. FINDINGS: MRI HEAD FINDINGS Brain: Cerebral volume within normal limits for age. Single punctate subcentimeter focus of T2/FLAIR signal abnormality seen involving the subcortical white matter of the left frontal operculum (series 17, image 89), nonspecific, and of doubtful significance in isolation. No other focal parenchymal signal abnormality. No other findings to suggest demyelinating disease. No abnormal foci of restricted diffusion to suggest acute or subacute ischemia. Gray-white matter differentiation maintained. No encephalomalacia to suggest chronic cortical infarction. No evidence for acute or chronic intracranial hemorrhage. No mass lesion, midline shift or mass effect. No hydrocephalus or extra-axial fluid collection. Pituitary gland suprasellar region normal. Midline structures intact and normal. No abnormal enhancement. Vascular: Major intracranial vascular flow voids are well maintained and normal. Skull and upper  cervical spine: Craniocervical junction within  normal limits. Bone marrow signal intensity normal. No scalp soft tissue abnormality. Sinuses/Orbits: Globes and orbital soft tissues within normal limits. Paranasal sinuses are clear. No mastoid effusion. Inner ear structures grossly normal. Other: None. MRI CERVICAL SPINE FINDINGS Alignment: Straightening of the normal cervical lordosis. Trace degenerative retrolisthesis of C5 on C6. Vertebrae: Vertebral body height maintained without acute or chronic fracture. Bone marrow signal intensity diffusely decreased on T1 weighted imaging, nonspecific, but most commonly related to anemia, smoking, or obesity. No discrete or worrisome osseous lesions. No abnormal marrow edema or enhancement. Cord: Normal signal and morphology.  No abnormal enhancement. Posterior Fossa, vertebral arteries, paraspinal tissues: Craniocervical junction normal. Posterior and prevertebral soft tissues within normal limits. Normal flow voids seen within the vertebral arteries bilaterally. Disc levels: C2-C3: Unremarkable. C3-C4:  Unremarkable. C4-C5:  Unremarkable. C5-C6: Trace retrolisthesis. Degenerative intervertebral disc space narrowing with diffuse disc bulge and bilateral uncovertebral hypertrophy. Mild flattening of the ventral thecal sac without significant spinal stenosis or cord deformity. Moderate bilateral C6 foraminal narrowing. C6-C7: Degenerative intervertebral disc space narrowing with diffuse disc bulge. Mild left greater than right uncovertebral spurring. No spinal stenosis. Mild left greater than right C7 foraminal narrowing. C7-T1:  Normal interspace.  Mild facet hypertrophy.  No stenosis. Visualized upper thoracic spine demonstrates a tiny right central disc protrusion at T3-4 without significant stenosis or impingement. IMPRESSION: 1. Normal brain MRI.  No acute intracranial abnormality identified. 2. Normal MRI appearance of the cervical spinal cord. 3. Mild degenerative disc disease at C5-6 and C6-7 with resultant mild  to moderate bilateral C6 and C7 foraminal stenosis as above. Electronically Signed   By: Rise Mu M.D.   On: 03/16/2021 22:59   MR Cervical Spine W or Wo Contrast  Result Date: 03/16/2021 CLINICAL DATA:  Initial evaluation for neuro deficit, stroke suspected, numbness. EXAM: MRI HEAD WITHOUT AND WITH CONTRAST MRI CERVICAL SPINE WITHOUT AND WITH CONTRAST TECHNIQUE: Multiplanar, multiecho pulse sequences of the brain and surrounding structures, and cervical spine, to include the craniocervical junction and cervicothoracic junction, were obtained without and with intravenous contrast. CONTRAST:  48mL GADAVIST GADOBUTROL 1 MMOL/ML IV SOLN COMPARISON:  Prior head CT from earlier the same day. FINDINGS: MRI HEAD FINDINGS Brain: Cerebral volume within normal limits for age. Single punctate subcentimeter focus of T2/FLAIR signal abnormality seen involving the subcortical white matter of the left frontal operculum (series 17, image 89), nonspecific, and of doubtful significance in isolation. No other focal parenchymal signal abnormality. No other findings to suggest demyelinating disease. No abnormal foci of restricted diffusion to suggest acute or subacute ischemia. Gray-white matter differentiation maintained. No encephalomalacia to suggest chronic cortical infarction. No evidence for acute or chronic intracranial hemorrhage. No mass lesion, midline shift or mass effect. No hydrocephalus or extra-axial fluid collection. Pituitary gland suprasellar region normal. Midline structures intact and normal. No abnormal enhancement. Vascular: Major intracranial vascular flow voids are well maintained and normal. Skull and upper cervical spine: Craniocervical junction within normal limits. Bone marrow signal intensity normal. No scalp soft tissue abnormality. Sinuses/Orbits: Globes and orbital soft tissues within normal limits. Paranasal sinuses are clear. No mastoid effusion. Inner ear structures grossly normal.  Other: None. MRI CERVICAL SPINE FINDINGS Alignment: Straightening of the normal cervical lordosis. Trace degenerative retrolisthesis of C5 on C6. Vertebrae: Vertebral body height maintained without acute or chronic fracture. Bone marrow signal intensity diffusely decreased on T1 weighted imaging, nonspecific, but most commonly related to anemia, smoking, or obesity. No discrete or  worrisome osseous lesions. No abnormal marrow edema or enhancement. Cord: Normal signal and morphology.  No abnormal enhancement. Posterior Fossa, vertebral arteries, paraspinal tissues: Craniocervical junction normal. Posterior and prevertebral soft tissues within normal limits. Normal flow voids seen within the vertebral arteries bilaterally. Disc levels: C2-C3: Unremarkable. C3-C4:  Unremarkable. C4-C5:  Unremarkable. C5-C6: Trace retrolisthesis. Degenerative intervertebral disc space narrowing with diffuse disc bulge and bilateral uncovertebral hypertrophy. Mild flattening of the ventral thecal sac without significant spinal stenosis or cord deformity. Moderate bilateral C6 foraminal narrowing. C6-C7: Degenerative intervertebral disc space narrowing with diffuse disc bulge. Mild left greater than right uncovertebral spurring. No spinal stenosis. Mild left greater than right C7 foraminal narrowing. C7-T1:  Normal interspace.  Mild facet hypertrophy.  No stenosis. Visualized upper thoracic spine demonstrates a tiny right central disc protrusion at T3-4 without significant stenosis or impingement. IMPRESSION: 1. Normal brain MRI.  No acute intracranial abnormality identified. 2. Normal MRI appearance of the cervical spinal cord. 3. Mild degenerative disc disease at C5-6 and C6-7 with resultant mild to moderate bilateral C6 and C7 foraminal stenosis as above. Electronically Signed   By: Rise Mu M.D.   On: 03/16/2021 22:59    Procedures Procedures   Medications Ordered in ED Medications  gadobutrol (GADAVIST) 1 MMOL/ML  injection 10 mL (10 mLs Intravenous Contrast Given 03/16/21 2044)    ED Course  I have reviewed the triage vital signs and the nursing notes.  Pertinent labs & imaging results that were available during my care of the patient were reviewed by me and considered in my medical decision making (see chart for details).    MDM Rules/Calculators/A&P                          45 year old female who presents for evaluation of right thigh numbness that began a week ago.  Today started having numbness in her right arm, right face.  No weakness.  No speech difficulty, facial drooping.  No fevers.  On initial arrival, she is afebrile, nontoxic-appearing.  Vital signs are stable.  On neuro exam, she exhibits no weakness.  She does have decreased sensation of the right anterior thigh does not extend posteriorly.  She does have a little bit of extension down the tib-fib area.  She also reports decreased sensation to the posterior aspect of right upper extremity as well as V2 and V3 of her right face.  Do not suspect stroke.  History/physical exam not concerning for GBS, doubt transaminitis.  MS is a consideration given age.  We will start with labs, CT head.  I-STAT beta negative.  CMP is normal.  CBC shows slight leukocytosis of 10.7.  Beta quant negative.  CT head negative.  Discussed patient with Dr. Derry Lory (Neuro) who recommends MRI Brain and C spine w and wo contrast.   MRI brain is normal.  MRI of C-spine shows mild degenerative disease but no other acute abnormality.  Discussed patient with Dr. Derry Lory (Neuro) regarding patient's MRIs. He reviewed patients imaging. Recommends outpatient neuro follow up.   Updated patient on plan.  Patient able to ambulate in the ED with no signs of gait ataxia, weakness.  Patient given ambulatory referral to neuro. At this time, patient exhibits no emergent life-threatening condition that require further evaluation in ED. Patient had ample opportunity for questions  and discussion. All patient's questions were answered with full understanding.  Strict return precautions discussed. Patient expresses understanding and agreement to plan.  Portions of this note were generated with Scientist, clinical (histocompatibility and immunogenetics). Dictation errors may occur despite best attempts at proofreading.   Final Clinical Impression(s) / ED Diagnoses Final diagnoses:  Numbness    Rx / DC Orders ED Discharge Orders          Ordered    Ambulatory referral to Neurology       Comments: An appointment is requested in approximately: 1 week   03/16/21 2313             Maxwell Caul, PA-C 03/16/21 2317    Jacalyn Lefevre, MD 03/16/21 2322

## 2021-03-16 NOTE — Discharge Instructions (Signed)
As we discussed, your MRI imaging looked reassuring and did not show any signs of infection, stroke that would be causing your symptoms.  The next step is following up with outpatient neurology.  We have provided a referral.  Return to emergency department if you feel like you have any weakness, difficulty walking, facial drooping, slurred speech.

## 2021-03-16 NOTE — ED Provider Notes (Signed)
Emergency Medicine Provider Triage Evaluation Note  KHALIYAH NORTHROP , a 45 y.o. female  was evaluated in triage. Pt complains of numbness. Began last week to unilateral leg on right. Initially to right anterior, lateral thigh. Yesterday numbness progressed to right gluteal region and this morning now with numbness to right anterior shin with awakening. Feels like leg giving out yesterday with almost falling. No back pain, IVDU, saddle anesthesia. Now this morning approximately 5 PTA developed tingling to right lower jaw. Feels like she now has pin and needles to lateral right forearm as well. Dropped something this morning in right hand. No hx of CVA. No HA.  Review of Systems  Positive: Numbness, weakness Negative: Facial droop Physical Exam  BP 131/90 (BP Location: Left Arm)   Pulse 90   Temp 98.6 F (37 C) (Oral)   Resp 16   SpO2 99%  Gen:   Awake, no distress   Resp:  Normal effort  MSK:   Moves extremities without difficulty  NEURO: Cn 2-12 grossly intact. No facial droop. No difficult speaking, Ambulatory without ataxic gait. Equal hand grip bilaterally. Negative pronator drift. Equal strength in BL upper and lower extremities Other:    Medical Decision Making  Medically screening exam initiated at 4:42 PM.  Appropriate orders placed.  JOESPHINE SCHEMM was informed that the remainder of the evaluation will be completed by another provider, this initial triage assessment does not replace that evaluation, and the importance of remaining in the ED until their evaluation is complete.  Numbness, initially began 1 week ago.   Not a code stroke due to sx timing however nursing notified patient needs room in back.   Linwood Dibbles, PA-C 03/16/21 1643    Benjiman Core, MD 03/16/21 [phone]

## 2021-05-01 ENCOUNTER — Encounter: Payer: Self-pay | Admitting: *Deleted

## 2021-05-02 ENCOUNTER — Other Ambulatory Visit: Payer: Self-pay

## 2021-05-02 ENCOUNTER — Ambulatory Visit: Payer: No Typology Code available for payment source | Admitting: Neurology

## 2021-05-02 ENCOUNTER — Encounter: Payer: Self-pay | Admitting: Neurology

## 2021-05-02 VITALS — BP 114/79 | HR 77 | Ht 68.0 in | Wt 198.0 lb

## 2021-05-02 DIAGNOSIS — R519 Headache, unspecified: Secondary | ICD-10-CM | POA: Diagnosis not present

## 2021-05-02 DIAGNOSIS — G4719 Other hypersomnia: Secondary | ICD-10-CM | POA: Diagnosis not present

## 2021-05-02 DIAGNOSIS — R0683 Snoring: Secondary | ICD-10-CM

## 2021-05-02 DIAGNOSIS — Z820 Family history of epilepsy and other diseases of the nervous system: Secondary | ICD-10-CM | POA: Diagnosis not present

## 2021-05-02 DIAGNOSIS — R351 Nocturia: Secondary | ICD-10-CM

## 2021-05-02 DIAGNOSIS — E669 Obesity, unspecified: Secondary | ICD-10-CM

## 2021-05-02 NOTE — Progress Notes (Signed)
Subjective:    Patient ID: Pamela Marsh is a 45 y.o. female.  HPI    Huston Foley, MD, PhD [facility] [address] [address] [place], [place] [address]  Dear Dr. Ernestina Penna,   I saw your patient, Pamela Marsh, upon your kind request in my sleep clinic today for initial consultation of her sleep disorder, in particular, concern for underlying obstructive sleep apnea.  The patient is unaccompanied today.  As you know, Pamela Marsh is a 45 year old right-handed woman with an underlying medical history of depression, gestational diabetes, and obesity, who reports snoring and excessive daytime somnolence.  I reviewed your office note from 02/27/2021.  Her Epworth sleepiness score is 10 out of 24, fatigue severity score is 44 out of 63.  She has a longer standing history of snoring.  She had a sleep study some 8 or 10 years ago through her primary care physician and was told that she had borderline sleep apnea and CPAP was an option at the time but she did not pursue it.  Weight has been fluctuating, her highest was 230 pounds.  She is working on weight loss.  She has a history of low back pain and low back injury in the remote past.  She is wondering if she needs to see a spine specialist.  As far as her numbness goes, she has noticed recent improvement after she saw his massage therapist and she also had dry needling in the lower back.  She reports that she had right leg numbness and arm numbness and facial numbness recently.  Her husband took her to the emergency room.  She denies any recurrent headaches or headache at the time of her numbness.  She has not had any recent falls, her symptoms are improved to the point that she is wondering if she needs to keep her other appointment in this clinic. She has a family history of ALS in her maternal grandfather.  Father has sleep apnea.  Her husband also has sleep apnea.  She was suspected at 1 point in the distant past to  have narcolepsy but never had sleep testing for it.  She was placed on some medication for narcolepsy when she was in her 47s but stopped taking it.  She denies any severe sleepiness to the point where she falls asleep talking or standing up or at the wheel any longer.  She denies telltale cataplexy symptoms or recurrent sleep paralysis. She lives with her husband and 12-year-old daughter.  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  She has a bedtime of around 9 and rise time around 5 to 5:30 AM.  She has nocturia about once or twice per average night and has woken up occasionally with a headache.  She drinks caffeine in the form of coffee, by self admission 6 to 8 cups in the morning, typically no later than 2 PM.  She drinks alcohol occasionally, typically on the weekends, she has a history of smoking for about 5 years when she was in college and after.  She smoked a pack per day at the time.  They have a TV in the bedroom but she does not have it on at night.  They have pets in the household but none sleep in the bedroom.  He has a history of bruxism.  She has a bite guard for this.  She also had a recent consultation for invisaligns.  Of note, she recently presented to the emergency room some 6  weeks ago with right thigh numbness.  She had symptoms for about a week at the time and reported on 03/16/2021 she started having numbness in the right arm and face.  I reviewed the emergency room records.  She had imaging testing at the time including a CT head without contrast, brain and cervical spine MRI.  Her CT head without contrast from 03/16/2021 showed: Impression: No acute intracranial abnormality. She had a brain MRI with and without contrast as well as cervical spine MRI with and without contrast on 03/16/2021 and I reviewed the results:  IMPRESSION: 1. Normal brain MRI.  No acute intracranial abnormality identified. 2. Normal MRI appearance of the cervical spinal cord. 3. Mild degenerative disc disease at C5-6  and C6-7 with resultant mild to moderate bilateral C6 and C7 foraminal stenosis as above.   Her Past Medical History Is Significant For: Past Medical History:  Diagnosis Date   Compression fracture 2004   2 lumbar fractures. Healed with brace   Depression    hx of - years ago   Gestational diabetes    on glyburide-dly   Infection    UTI   Mental disorder    hx - no meds   Pregnancy induced hypertension    on meds around 33-34wks   Sleep apnea    no cpap - lost 25 lbs since study    Her Past Surgical History Is Significant For: Past Surgical History:  Procedure Laterality Date   DILATATION & CURRETTAGE/HYSTEROSCOPY WITH RESECTOCOPE N/A 03/30/2015   Procedure: DILATATION & CURETTAGE/ HYSTEROSCOPY WITH MYOSURE;  Surgeon: Noland Fordyce, MD;  Location: [facility];  Service: Gynecology;  Laterality: N/A;  1 hr. would like myosure available.   DILATION AND CURETTAGE OF UTERUS     DILATION AND EVACUATION  07/27/2012   Procedure: DILATATION AND EVACUATION;  Surgeon: Alphonsus Sias. Ernestina Penna, MD;  Location: [facility];  Service: Gynecology;  Laterality: N/A;  with ultrasound   OPERATIVE ULTRASOUND N/A 03/30/2015   Procedure:  OPERATIVE ULTRASOUND. ;  Surgeon: Noland Fordyce, MD;  Location: [facility];  Service: Gynecology;  Laterality: N/A;   right knee surgery     acl x 2    Her Family History Is Significant For: Family History  Problem Relation Age of Onset   Hypertension Father    Hyperlipidemia Father    Diabetes Father    Obesity Father    Sleep apnea Father    Snoring Father    Mental retardation Sister    Thyroid disease Sister    Diabetes Maternal Grandmother    Diabetes Paternal Grandmother    Cancer Maternal Aunt    Lung cancer Maternal Aunt    Cancer Other     Her Social History Is Significant For: Social History   Socioeconomic History   Marital status: Married    Spouse name: Not on file   Number of children: Not on file   Years of education: Not on file   Highest  education level: Not on file  Occupational History   Not on file  Tobacco Use   Smoking status: Former    Packs/day: 0.50    Years: 4.00    Pack years: 2.00    Types: Cigarettes    Quit date: 01/26/2000    Years since quitting: 21.2   Smokeless tobacco: Never  Vaping Use   Vaping Use: Never used  Substance and Sexual Activity   Alcohol use: Not Currently    Alcohol/week: 0.0 - 4.0 standard drinks  Comment: socially   Drug use: No   Sexual activity: Yes    Birth control/protection: None    Comment: husband had a vasectomy  Other Topics Concern   Not on file  Social History Narrative   Lives at home with husband and 7 y.o. daughter   Right handed   Caffeine: 8 cups/day   Social Determinants of Health   Financial Resource Strain: Not on file  Food Insecurity: Not on file  Transportation Needs: Not on file  Physical Activity: Not on file  Stress: Not on file  Social Connections: Not on file    Her Allergies Are:  Allergies  Allergen Reactions   Hydrocodone Nausea And Vomiting  :   Her Current Medications Are:  Outpatient Encounter Medications as of 05/02/2021  Medication Sig   BIOTIN PO Take by mouth.   MELATONIN PO Take by mouth.   Multiple Vitamin (MULTIVITAMIN PO) Take by mouth.   Omega-3 Fatty Acids (FISH OIL PO) Take 4 capsules by mouth daily.   Probiotic Product (PROBIOTIC PO) Take by mouth.   TURMERIC PO Take 10 tablets by mouth daily.   No facility-administered encounter medications on file as of 05/02/2021.  :   Review of Systems:  Out of a complete 14 point review of systems, all are reviewed and negative with the exception of these symptoms as listed below:  Review of Systems  Neurological:        Patient is here for sleep consult. She has fatigue and snoring that has been present "forever". She states she was previously diagnosed with Narcolepsy in her late 6820s however she doesn't feel sure she has this. She was placed on a medication that she  cannot recall the name. She states there was a point where she was falling asleep at stoplights.   Epworth Sleepiness Scale 0= would never doze 1= slight chance of dozing 2= moderate chance of dozing 3= high chance of dozing  Sitting and reading: 2 Watching TV: 2 Sitting inactive in a public place (ex. Theater or meeting): 0 As a passenger in a car for an hour without a break: 0 Lying down to rest in the afternoon: 3 Sitting and talking to someone: 0 Sitting quietly after lunch (no alcohol): 2 In a car, while stopped in traffic: 1 Total: 10  FSS 44    Objective:  Neurological Exam  Physical Exam Physical Examination:   Vitals:   05/02/21 1238  BP: 114/79  Pulse: 77    General Examination: The patient is a very pleasant 45 y.o. female in no acute distress. She appears well-developed and well-nourished and well groomed.   HEENT: Normocephalic, atraumatic, pupils are equal, round and reactive to light, extraocular tracking is good without limitation to gaze excursion or nystagmus noted. Hearing is grossly intact. Face is symmetric with normal facial animation. Speech is clear with no dysarthria noted. There is no hypophonia. There is no lip, neck/head, jaw or voice tremor. Neck is supple with full range of passive and active motion. There are no carotid bruits on auscultation. Oropharynx exam reveals: mild mouth dryness, good dental hygiene and mild airway crowding, due to small airway, tonsils on the smaller side, uvula slim and slightly elongated, Mallampati class I.  Tongue protrudes centrally and palate elevates symmetrically, neck circumference of 14 and three-quarter inches.  She has a moderate overbite.  Chest: Clear to auscultation without wheezing, rhonchi or crackles noted.  Heart: S1+S2+0, regular and normal without murmurs, rubs or gallops noted.  Abdomen: Soft, non-tender and non-distended with normal bowel sounds appreciated on auscultation.  Extremities: There  is no pitting edema in the distal lower extremities bilaterally.   Skin: Warm and dry without trophic changes noted.   Musculoskeletal: exam reveals no obvious joint deformities, tenderness or joint swelling or erythema.   Neurologically:  Mental status: The patient is awake, alert and oriented in all 4 spheres. Her immediate and remote memory, attention, language skills and fund of knowledge are appropriate. There is no evidence of aphasia, agnosia, apraxia or anomia. Speech is clear with normal prosody and enunciation. Thought process is linear. Mood is normal and affect is normal.  Cranial nerves II - XII are as described above under HEENT exam.  Motor exam: Normal bulk, strength and tone is noted. There is no tremor, Romberg is negative. Fine motor skills and coordination: grossly intact.  Cerebellar testing: No dysmetria or intention tremor. There is no truncal or gait ataxia.  Sensory exam: intact to light touch in the upper and lower extremities.  Gait, station and balance: She stands easily. No veering to one side is noted. No leaning to one side is noted. Posture is age-appropriate and stance is narrow based. Gait shows normal stride length and normal pace. No problems turning are noted. Tandem walk is unremarkable.  Normal toe walking, ability to walk on her heels normal.  Assessment and Plan:  In summary, Pamela Marsh is a very pleasant 45 y.o.-year old female with an underlying medical history of depression, gestational diabetes, and obesity, whose history and physical exam are concerning for obstructive sleep apnea (OSA).  Her history is not telltale of an underlying hypersomnolence disorder such as narcolepsy. I had a long chat with the patient about my findings and the diagnosis of OSA, its prognosis and treatment options. We talked about medical treatments, surgical interventions and non-pharmacological approaches. I explained in particular the risks and ramifications of untreated  moderate to severe OSA, especially with respect to developing cardiovascular disease down the Road, including congestive heart failure, difficult to treat hypertension, cardiac arrhythmias, or stroke. Even type 2 diabetes has, in part, been linked to untreated OSA. Symptoms of untreated OSA include daytime sleepiness, memory problems, mood irritability and mood disorder such as depression and anxiety, lack of energy, as well as recurrent headaches, especially morning headaches. We talked about trying to maintain a healthy lifestyle in general, as well as the importance of weight control. We also talked about the importance of good sleep hygiene. I recommended the following at this time: sleep study.  I explained the difference between a laboratory attended sleep study versus home sleep test to her. She is advised to keep her appointment in September so we can reevaluate her for her numbness.  At this time she has improved significantly enough that she does not wish to pursue an EMG test.  Recent brain and cervical spine MRIs were benign.  She is largely reassured. I explained the sleep test procedure to the patient and also outlined possible surgical and non-surgical treatment options of OSA, including the use of a custom-made dental device (which would require a referral to a specialist dentist or oral surgeon), upper airway surgical options, such as traditional UPPP or a novel less invasive surgical option in the form of Inspire hypoglossal nerve stimulation (which would involve a referral to an ENT surgeon). I also explained the CPAP treatment option to the patient, who indicated that she would be willing to try CPAP if the need arises.  We will pick up our discussion after testing and plan a follow-up accordingly.  I answered all her questions today and she was in agreement with the plan.  Thank you very much for allowing me to participate in the care of this nice patient. If I can be of any further  assistance to you please do not hesitate to call me at [phone].  Sincerely,   Huston Foley, MD, PhD

## 2021-05-02 NOTE — Patient Instructions (Signed)

## 2021-05-06 ENCOUNTER — Telehealth: Payer: Self-pay

## 2021-05-06 NOTE — Telephone Encounter (Signed)
LVM for pt to call me back to schedule sleep study  

## 2021-05-09 ENCOUNTER — Telehealth: Payer: Self-pay | Admitting: Neurology

## 2021-05-09 NOTE — Telephone Encounter (Signed)
Pt. Was left a voice mail today to call back to schedule her sleep study appt.

## 2021-06-05 ENCOUNTER — Ambulatory Visit (INDEPENDENT_AMBULATORY_CARE_PROVIDER_SITE_OTHER): Payer: No Typology Code available for payment source | Admitting: Neurology

## 2021-06-05 DIAGNOSIS — R519 Headache, unspecified: Secondary | ICD-10-CM

## 2021-06-05 DIAGNOSIS — G4710 Hypersomnia, unspecified: Secondary | ICD-10-CM | POA: Diagnosis not present

## 2021-06-05 DIAGNOSIS — E669 Obesity, unspecified: Secondary | ICD-10-CM

## 2021-06-05 DIAGNOSIS — R0683 Snoring: Secondary | ICD-10-CM

## 2021-06-05 DIAGNOSIS — Z820 Family history of epilepsy and other diseases of the nervous system: Secondary | ICD-10-CM

## 2021-06-05 DIAGNOSIS — G4719 Other hypersomnia: Secondary | ICD-10-CM

## 2021-06-05 DIAGNOSIS — R351 Nocturia: Secondary | ICD-10-CM

## 2021-06-12 ENCOUNTER — Institutional Professional Consult (permissible substitution): Payer: No Typology Code available for payment source | Admitting: Neurology

## 2021-06-12 ENCOUNTER — Encounter: Payer: Self-pay | Admitting: Neurology

## 2021-06-12 ENCOUNTER — Telehealth: Payer: Self-pay | Admitting: *Deleted

## 2021-06-12 NOTE — Telephone Encounter (Signed)
Pt no showed appointment x 1 for numnbess consult.

## 2021-06-13 NOTE — Procedures (Signed)
      [facility]  HOME SLEEP TEST (Watch PAT) REPORT  STUDY DATE: 06/05/2021  DOB: 1976-04-27  MRN: [id number]  ORDERING CLINICIAN: Huston Foley, MD, PhD   REFERRING CLINICIAN: Dr. Ernestina Penna  CLINICAL INFORMATION/HISTORY: 45 year old right-handed woman with an underlying medical history of depression, gestational diabetes, and obesity, who reports snoring and excessive daytime somnolence.  Epworth sleepiness score: 10/24.  BMI: 30.1 kg/m  FINDINGS:   Sleep Summary:   Total Recording Time (hours, min): 6 hours, 51 minutes  Total Sleep Time (hours, min):  4 hours, 50 minutes   Percent REM (%):    21.2%   Respiratory Indices:   Calculated pAHI (per hour):  1.2/hour         REM pAHI:    3.5/hour       NREM pAHI: 0.6/hour  Oxygen Saturation Statistics:    Oxygen Saturation (%) Mean: 95%   Minimum oxygen saturation (%):                 93%   O2 Saturation Range (%): 93-99%    O2 Saturation (minutes) <=88%: 0 min  Pulse Rate Statistics:   Pulse Mean (bpm):    74/min    Pulse Range (49-102/min)   IMPRESSION: Primary snoring  RECOMMENDATION:  This home sleep test does not demonstrate any significant obstructive or central sleep disordered breathing.  Snoring was noted, mostly in the mild range, towards the end of the study in the moderate to even louder range.  Treatment with positive airway pressure, such as a CPAP or AutoPap device is not necessary.  Weight loss may aid in reducing her snoring.  Other causes of the patient's symptoms, including circadian rhythm disturbances, an underlying mood disorder, medication effect and/or an underlying medical problem cannot be ruled out based on this test. Clinical correlation is recommended. The patient should be cautioned not to drive, work at heights, or operate dangerous or heavy equipment when tired or sleepy. Review and reiteration of good sleep hygiene measures should be pursued with any patient. The  patient can follow up with her referring provider, who will be notified of the test results. An appointment in sleep clinic can be made as necessary.   I certify that I have reviewed the raw data recording prior to the issuance of this report in accordance with the standards of the American Academy of Sleep Medicine (AASM).   INTERPRETING PHYSICIAN:   Huston Foley, MD, PhD  Board Certified in Neurology and Sleep Medicine  [facility] [address], [address] [place], [place] [address] [phone]

## 2021-06-13 NOTE — Progress Notes (Signed)
See procedure note.

## 2021-06-17 ENCOUNTER — Telehealth: Payer: Self-pay | Admitting: *Deleted

## 2021-06-17 NOTE — Telephone Encounter (Signed)
-----   Message from Huston Foley, MD sent at 06/13/2021  4:40 PM EDT ----- Patient referred by Dr. Ernestina Penna, seen by me on 05/02/2021, HST on 06/05/2021.   Please call and notify the patient that the recent home sleep test did not show any significant obstructive sleep apnea.  Snoring was noted, ranging from mild to loud.  Weight loss will likely aid in reducing her snoring but, thankfully, she does not need to be treated with a CPAP or AutoPap machine.  At this point, she can follow-up with her primary care and Dr. Ernestina Penna as scheduled. Thanks,  Huston Foley, MD, PhD [facility])

## 2021-06-17 NOTE — Telephone Encounter (Signed)
I spoke with the pt and we discussed the sleep study results.  Patient understands the study did not show significant OSA thus she does not need to use CPAP or AutoPap.  She did have snoring that ranged from mild to loud.  Weight loss will likely aid in reducing her snoring.  She can follow-up with primary care and Dr Ernestina Penna.  Patient verbalized understanding and appreciation.  She did mention that she has snored her whole life even when she weighed 145 pounds before the weight gain.  I let her know I would send a message over to Dr. Frances Furbish as an Lorain Childes.   Results sent to Dr Ernestina Penna, referring MD.

## 2021-09-29 ENCOUNTER — Emergency Department (HOSPITAL_BASED_OUTPATIENT_CLINIC_OR_DEPARTMENT_OTHER)
Admission: EM | Admit: 2021-09-29 | Discharge: 2021-09-29 | Disposition: A | Discharge status: Home / Self Care | Payer: No Typology Code available for payment source | Attending: Emergency Medicine | Admitting: Emergency Medicine

## 2021-09-29 ENCOUNTER — Other Ambulatory Visit: Payer: Self-pay

## 2021-09-29 ENCOUNTER — Encounter (HOSPITAL_BASED_OUTPATIENT_CLINIC_OR_DEPARTMENT_OTHER): Payer: Self-pay

## 2021-09-29 DIAGNOSIS — Z87891 Personal history of nicotine dependence: Secondary | ICD-10-CM | POA: Insufficient documentation

## 2021-09-29 DIAGNOSIS — M79641 Pain in right hand: Secondary | ICD-10-CM | POA: Diagnosis not present

## 2021-09-29 NOTE — ED Triage Notes (Signed)
She describes holding shopping bags hooked into her right index and middle fingers. She then tells [place] that, as she was holding those bags, she attempted to open her car door with her right ring finger, which she describes was stressed and hyperextended. She is here today with c/o pain, stiffness and mild edema at right index finger and distal medial wrist areas. She spoke with a provider at [facility] (her orthopedist), who advised her to come here to be checked.

## 2021-09-29 NOTE — Discharge Instructions (Addendum)
You are seen in the emergency department today for hand pain.  As we discussed I have very low suspicion that you have any fractures or dislocations in your hand or wrist.  I think that your swelling and pain is likely from a muscle strain following the pattern of your fingers into your wrist.  We have placed this in a wrist brace, to help reduce the movement.  You can continue resting it, icing it, compressing it, and elevating it.  You can continue taking ibuprofen or Tylenol as needed for pain.  I would like you to make your orthopedist aware of what we talked about today, and follow-up with them in the office if they feel so necessary.  I think by the end of the week you should start to have some relief.  It has been a pleasure seeing and caring for you today and I hope you start feeling better soon!

## 2021-09-29 NOTE — ED Provider Notes (Signed)
[facility] EMERGENCY DEPT Provider Note   CSN: [id number] Arrival date & time: 09/29/21  1620     History Chief Complaint  Patient presents with   Hand Pain    Pamela Marsh is a 45 y.o. female complaining of right hand pain.  Patient states that last night she was carrying shopping bags inside that were hooked on her right index and right middle finger.  She states she then tried to use her right ring finger to open the car door, and believes that she hyperextended this.  She is here today complaining of pain, stiffness, and mild edema in her right index finger, and at the distal medial wrist.  She spoke with her provider at [facility], who advised her to come to the emergency department for evaluation.  No other trauma noted.   Hand Pain      Past Medical History:  Diagnosis Date   Compression fracture 2004   2 lumbar fractures. Healed with brace   Depression    hx of - years ago   Gestational diabetes    on glyburide-dly   Infection    UTI   Mental disorder    hx - no meds   Pregnancy induced hypertension    on meds around 33-34wks   Sleep apnea    no cpap - lost 25 lbs since study    Patient Active Problem List   Diagnosis Date Noted   Postpartum care following vaginal delivery (6/12) 03/17/2014   Preeclampsia 03/15/2014    Past Surgical History:  Procedure Laterality Date   DILATATION & CURRETTAGE/HYSTEROSCOPY WITH RESECTOCOPE N/A 03/30/2015   Procedure: DILATATION & CURETTAGE/ HYSTEROSCOPY WITH MYOSURE;  Surgeon: Noland Fordyce, MD;  Location: [facility];  Service: Gynecology;  Laterality: N/A;  1 hr. would like myosure available.   DILATION AND CURETTAGE OF UTERUS     DILATION AND EVACUATION  07/27/2012   Procedure: DILATATION AND EVACUATION;  Surgeon: Alphonsus Sias. Ernestina Penna, MD;  Location: [facility];  Service: Gynecology;  Laterality: N/A;  with ultrasound   OPERATIVE ULTRASOUND N/A 03/30/2015   Procedure:  OPERATIVE ULTRASOUND. ;  Surgeon: Noland Fordyce, MD;  Location: [facility];  Service: Gynecology;  Laterality: N/A;   right knee surgery     acl x 2     OB History     Gravida  2   Para  1   Term  1   Preterm      AB  1   Living  1      SAB  1   IAB      Ectopic      Multiple      Live Births  1           Family History  Problem Relation Age of Onset   Hypertension Father    Hyperlipidemia Father    Diabetes Father    Obesity Father    Sleep apnea Father    Snoring Father    Mental retardation Sister    Thyroid disease Sister    Diabetes Maternal Grandmother    Diabetes Paternal Grandmother    Cancer Maternal Aunt    Lung cancer Maternal Aunt    Cancer Other     Social History   Tobacco Use   Smoking status: Former    Packs/day: 0.50    Years: 4.00    Pack years: 2.00    Types: Cigarettes    Quit date: 01/26/2000    Years since quitting:  21.6   Smokeless tobacco: Never  Vaping Use   Vaping Use: Never used  Substance Use Topics   Alcohol use: Not Currently    Alcohol/week: 0.0 - 4.0 standard drinks    Comment: socially   Drug use: No    Home Medications Prior to Admission medications   Medication Sig Start Date End Date Taking? Authorizing Provider  BIOTIN PO Take by mouth.    [provider]  MELATONIN PO Take by mouth.    [provider]  Multiple Vitamin (MULTIVITAMIN PO) Take by mouth.    [provider]  Omega-3 Fatty Acids (FISH OIL PO) Take 4 capsules by mouth daily.    [provider]  Probiotic Product (PROBIOTIC PO) Take by mouth.    [provider]  TURMERIC PO Take 10 tablets by mouth daily.    [provider]    Allergies    Hydrocodone  Review of Systems   Review of Systems  Musculoskeletal:        Right index, middle, ring finger swelling and pain.  Right wrist swelling and pain  Skin:  Negative for wound.  All other systems reviewed and are negative.  Physical Exam Updated Vital Signs BP (!)  136/92    Pulse 62    Temp 99.4 F (37.4 C) (Oral)    Resp 16    Ht 5\' 8"  (1.727 m)    Wt 90.7 kg    SpO2 100%    BMI 30.41 kg/m   Physical Exam Vitals and nursing note reviewed.  Constitutional:      Appearance: Normal appearance.  HENT:     Head: Normocephalic and atraumatic.  Eyes:     Conjunctiva/sclera: Conjunctivae normal.  Pulmonary:     Effort: Pulmonary effort is normal. No respiratory distress.  Musculoskeletal:     Comments: Mildly decreased right fourth digit passive extension. All other range of motion intact.  There is mild swelling noted to the right second, third, and fourth digits.  There is no point tenderness.  No gross deformities palpated.  Patient has full passive ROM of her right wrist.  Good capillary refill, station intact.  Pulses equal and normal in bilateral upper extremities.   Skin:    General: Skin is warm and dry.  Neurological:     Mental Status: She is alert.  Psychiatric:        Mood and Affect: Mood normal.        Behavior: Behavior normal.    ED Results / Procedures / Treatments   Labs (all labs ordered are listed, but only abnormal results are displayed) Labs Reviewed - No data to display  EKG None  Radiology No results found.  Procedures Procedures   Medications Ordered in ED Medications - No data to display  ED Course  I have reviewed the triage vital signs and the nursing notes.  Pertinent labs & imaging results that were available during my care of the patient were reviewed by me and considered in my medical decision making (see chart for details).    MDM Rules/Calculators/A&P                          Patient is a 45 year old female presents to the emergency department complaining of right hand and wrist pain after hyperextension injury last night.  No other trauma noted.  On my exam patient has mildly decreased right fourth digit passive extension on exam.  All other  range of motion intact.  She is neurovascularly intact  as above.  There is mild swelling noted to the right second, third, and fourth digits.  There is no point tenderness.  No gross deformities palpated.  Patient has full passive ROM of her right wrist.  Good capillary refill.  I have low concern for fractures or dislocations, as patient has overall good range of motion and intact sensation.  I also have low concern for an acute nerve injury due to the mechanism and my relatively benign exam.  Do not believe patient requires admission or inpatient treatment.  Will splint her wrist, and recommend orthopedic follow-up with her provider.  Discussed reasons to return to the emergency department, patient agreeable to plan.  Final Clinical Impression(s) / ED Diagnoses Final diagnoses:  Hand pain, right    Rx / DC Orders ED Discharge Orders     None      Portions of this report may have been transcribed using voice recognition software. Every effort was made to ensure accuracy; however, inadvertent computerized transcription errors may be present.    Jeanella Flattery 09/29/21 1743    Tegeler, Canary Brim, MD 09/30/21 [phone]

## 2021-12-23 ENCOUNTER — Emergency Department (HOSPITAL_BASED_OUTPATIENT_CLINIC_OR_DEPARTMENT_OTHER): Payer: No Typology Code available for payment source

## 2021-12-23 ENCOUNTER — Other Ambulatory Visit: Payer: Self-pay

## 2021-12-23 ENCOUNTER — Encounter (HOSPITAL_BASED_OUTPATIENT_CLINIC_OR_DEPARTMENT_OTHER): Payer: Self-pay

## 2021-12-23 ENCOUNTER — Emergency Department (HOSPITAL_BASED_OUTPATIENT_CLINIC_OR_DEPARTMENT_OTHER)
Admission: EM | Admit: 2021-12-23 | Discharge: 2021-12-24 | Disposition: A | Discharge status: Home / Self Care | Payer: No Typology Code available for payment source | Attending: Emergency Medicine | Admitting: Emergency Medicine

## 2021-12-23 DIAGNOSIS — R1013 Epigastric pain: Secondary | ICD-10-CM

## 2021-12-23 DIAGNOSIS — R112 Nausea with vomiting, unspecified: Secondary | ICD-10-CM | POA: Diagnosis not present

## 2021-12-23 DIAGNOSIS — D72829 Elevated white blood cell count, unspecified: Secondary | ICD-10-CM | POA: Insufficient documentation

## 2021-12-23 DIAGNOSIS — R197 Diarrhea, unspecified: Secondary | ICD-10-CM | POA: Diagnosis not present

## 2021-12-23 LAB — URINALYSIS, ROUTINE W REFLEX MICROSCOPIC
Bilirubin Urine: NEGATIVE
Glucose, UA: NEGATIVE mg/dL
Ketones, ur: NEGATIVE mg/dL
Leukocytes,Ua: NEGATIVE
Nitrite: NEGATIVE
Protein, ur: NEGATIVE mg/dL
Specific Gravity, Urine: 1.01 (ref 1.005–1.030)
pH: 5 (ref 5.0–8.0)

## 2021-12-23 LAB — CBC
HCT: 48.1 % — ABNORMAL HIGH (ref 36.0–46.0)
Hemoglobin: 15.9 g/dL — ABNORMAL HIGH (ref 12.0–15.0)
MCH: 30.6 pg (ref 26.0–34.0)
MCHC: 33.1 g/dL (ref 30.0–36.0)
MCV: 92.7 fL (ref 80.0–100.0)
Platelets: 262 10*3/uL (ref 150–400)
RBC: 5.19 MIL/uL — ABNORMAL HIGH (ref 3.87–5.11)
RDW: 12.2 % (ref 11.5–15.5)
WBC: 13.8 10*3/uL — ABNORMAL HIGH (ref 4.0–10.5)
nRBC: 0 % (ref 0.0–0.2)

## 2021-12-23 LAB — COMPREHENSIVE METABOLIC PANEL
ALT: 13 U/L (ref 0–44)
AST: 21 U/L (ref 15–41)
Albumin: 4.5 g/dL (ref 3.5–5.0)
Alkaline Phosphatase: 48 U/L (ref 38–126)
Anion gap: 11 (ref 5–15)
BUN: 17 mg/dL (ref 6–20)
CO2: 23 mmol/L (ref 22–32)
Calcium: 10.2 mg/dL (ref 8.9–10.3)
Chloride: 104 mmol/L (ref 98–111)
Creatinine, Ser: 0.74 mg/dL (ref 0.44–1.00)
GFR, Estimated: >60 mL/min (ref >60)
Glucose, Bld: 88 mg/dL (ref 70–99)
Potassium: 3.6 mmol/L (ref 3.5–5.1)
Sodium: 138 mmol/L (ref 135–145)
Total Bilirubin: 0.4 mg/dL (ref 0.3–1.2)
Total Protein: 7.7 g/dL (ref 6.5–8.1)

## 2021-12-23 LAB — PREGNANCY, URINE: Preg Test, Ur: NEGATIVE

## 2021-12-23 LAB — LIPASE, BLOOD: Lipase: 28 U/L (ref 11–51)

## 2021-12-23 NOTE — ED Triage Notes (Signed)
Patient here POV for ABD Pain. ? ?Pain has been present for 2 weeks and has been worsening since it began. Usually only present when Patient eats. ? ?Pain is Mid/Upper ABD and Non-Radiating.  ? ?Associated with N/V/D. No Known Fevers.  ? ?Sent by PCP for Evaluation. NAD Noted during Triage. A&Ox4. GCS 15. Ambulatory.  ?

## 2021-12-23 NOTE — ED Provider Notes (Signed)
? ?[facility] EMER[facility]ENCY DEPT  ?Provider Note ? ?CSN: [id number] ?Arrival date & time: 12/23/21 1802 ? ?History ?Chief Complaint  ?Patient presents with  ? Abdominal Pain  ? ? ?Pamela Marsh is a 46 y.o. female reports 2 weeks of progressively worsening post-prandial epigastric pain, associated with nausea, occasional vomiting and diarrhea. No fevers. No prior history of same. Seen by PCP and sent to the ED for evaluation.  ? ? ?Home Medications ?Prior to Admission medications   ?Medication Sig Start Date End Date Taking? Authorizing Provider  ?pantoprazole (PROTONIX) 40 MG tablet Take 1 tablet (40 mg total) by mouth daily. 12/24/21  Yes Pollyann Savoy, MD  ?BIOTIN PO Take by mouth.    [provider]  ?MELATONIN PO Take by mouth.    [provider]  ?Multiple Vitamin (MULTIVITAMIN PO) Take by mouth.    [provider]  ?Omega-3 Fatty Acids (FISH OIL PO) Take 4 capsules by mouth daily.    [provider]  ?Probiotic Product (PROBIOTIC PO) Take by mouth.    [provider]  ?TURMERIC PO Take 10 tablets by mouth daily.    [provider]  ? ? ? ?Allergies    ?Hydrocodone ? ? ?Review of Systems   ?Review of Systems ?Please see HPI for pertinent positives and negatives ? ?Physical Exam ?BP 135/84   Pulse 65   Temp 98.3 ?F (36.8 ?C)   Resp 17   Ht 5\' 8"  (1.727 m)   Wt 90.7 kg   SpO2 100%   BMI 30.40 kg/m?  ? ?Physical Exam ?Vitals and nursing note reviewed.  ?Constitutional:   ?   Appearance: Normal appearance.  ?HENT:  ?   Head: Normocephalic and atraumatic.  ?   Nose: Nose normal.  ?   Mouth/Throat:  ?   Mouth: Mucous membranes are moist.  ?Eyes:  ?   Extraocular Movements: Extraocular movements intact.  ?   Conjunctiva/sclera: Conjunctivae normal.  ?Cardiovascular:  ?   Rate and Rhythm: Normal rate.  ?Pulmonary:  ?   Effort: Pulmonary effort is normal.  ?   Breath sounds: Normal breath sounds.  ?Abdominal:  ?   General: Abdomen is flat.  ?    Palpations: Abdomen is soft.  ?   Tenderness: There is abdominal tenderness in the epigastric area. There is no guarding. Negative signs include Murphy's sign and McBurney's sign.  ?Musculoskeletal:     ?   General: No swelling. Normal range of motion.  ?   Cervical back: Neck supple.  ?Skin: ?   General: Skin is warm and dry.  ?Neurological:  ?   General: No focal deficit present.  ?   Mental Status: She is alert.  ?Psychiatric:     ?   Mood and Affect: Mood normal.  ? ? ?ED Results / Procedures / Treatments   ?EKG ?None ? ?Procedures ?Procedures ? ?Medications Ordered in the ED ?Medications - No data to display ? ?Initial Impression and Plan ? Patient with post-prandial epigastric pain, concern for biliary colic. Labs done in triage show CBC with mild leukocytosis, CMP and lipase are normal. Will send for to eval.  ? ?ED Course  ? ?Clinical Course as of 12/24/21 0017  ?Mon Dec 23, 2021  ?2331 UA is clear  [CS]  ?Tue Dec 24, 2021  ?0014 I personally viewed the images from radiology studies and agree with radiologist interpretation: 0015 neg for gall bladder disease.  ? [CS]  ?[place] Patient  resting comfortably. ED workup is reassuring. Will start PPI for possible PUD. Recommend outpatient GI follow up. RTED for any worsening or concerning symptoms. Given presenting complaint, I considered that admission might be necessary. After review of results from ED lab and/or imaging studies, admission to the hospital is not indicated at this time.  ? [CS]  ?  ?Clinical Course User Index ?[CS] Pollyann Savoy, MD  ? ? ? ?MDM Rules/Calculators/A&P ?Medical Decision Making ?Problems Addressed: ?Epigastric pain: acute illness or injury ? ?Amount and/or Complexity of Data Reviewed ?Labs: ordered. Decision-making details documented in ED Course. ?Radiology: ordered and independent interpretation performed. Decision-making details documented in ED Course. ? ?Risk ?Prescription drug management. ?Decision regarding  hospitalization. ? ? ? ?Final Clinical Impression(s) / ED Diagnoses ?Final diagnoses:  ?Epigastric pain  ? ? ?Rx / DC Orders ?ED Discharge Orders   ? ?      Ordered  ?  pantoprazole (PROTONIX) 40 MG tablet  Daily       ? 12/24/21 0017  ? ?  ?  ? ?  ? ?  ?Pollyann Savoy, MD ?12/24/21 0017 ? ?

## 2021-12-24 MED ORDER — PANTOPRAZOLE SODIUM 40 MG PO TBEC: 40.0000 mg | DELAYED_RELEASE_TABLET | Freq: Every day | ORAL | 0 refills | Status: DC

## 2021-12-24 NOTE — ED Notes (Signed)
Pt verbalizes understanding of discharge instructions. Opportunity for questioning and answers were provided. Pt discharged from ED to home with family.    

## 2021-12-25 ENCOUNTER — Ambulatory Visit: Payer: No Typology Code available for payment source | Admitting: Gastroenterology

## 2021-12-25 ENCOUNTER — Encounter: Payer: Self-pay | Admitting: Gastroenterology

## 2021-12-25 VITALS — BP 142/88 | HR 83 | Ht 68.0 in | Wt 206.4 lb

## 2021-12-25 DIAGNOSIS — R1013 Epigastric pain: Secondary | ICD-10-CM

## 2021-12-25 NOTE — Patient Instructions (Signed)
If you are age 45 or older, your body mass index should be between 23-30. Your Body mass index is 31.38 kg/m?Marland Kitchen If this is out of the aforementioned range listed, please consider follow up with your Primary Care Provider. ? ?If you are age 49 or younger, your body mass index should be between 19-25. Your Body mass index is 31.38 kg/m?Marland Kitchen If this is out of the aformentioned range listed, please consider follow up with your Primary Care Provider.  ? ?Continue Protonix  twice 40 mg daily. ? ?Stop Motrin and NSAIDs. ? ?Use Tylenol as needed for headaches. ? ?The [facility] providers would like to encourage you to use [facility] to communicate with providers for non-urgent requests or questions.  Due to long hold times on the telephone, sending your provider a message by [facility] may be a faster and more efficient way to get a response.  Please allow 48 business hours for a response.  Please remember that this is for non-urgent requests.  ? ?It was a pleasure to see you today! ? ?Thank you for trusting me with your gastrointestinal care!   ? ?Scott E.Tomasa Rand, MD  ? ?

## 2021-12-25 NOTE — Progress Notes (Signed)
HPI : Pamela Marsh is a very pleasant 46 year old female with a history of depression who is referred to [place] by Dr. Merri Brunette for further evaluation of chronic epigastric pain and nausea.  Patient states that her symptoms for started about 2 months ago.  She recalls being in a meeting at school when she had sudden pain in the upper abdomen and had to leave the meeting to vomit.  She had recurrent episodes of these symptoms until early March when she started having daily symptoms.  With pain is located in the epigastrium and is typically worsened 30 to 40 minutes after eating meals.  It typically last 2 to 3 hours before subsiding.  She is typically very nauseated when she has this pain, but has not vomited since that episode in January.  When not eating, she does report a vague mild epigastric discomfort and occasional nausea.  Her diet does not seem to affect her symptoms very much.  She has been following a bland diet for the last several days, but has not had much improvement. She is also experienced persistent diarrhea over the same time period.  She has significant urgency after meals and passes poorly formed, loose stools 4-5 times per day.  Previously, she would have 2 bowel movements per day.  She sometimes has the urge to defecate, but is unable to pass stool.  She denies nocturnal bowel movements.  No blood in the stool.  She has lost about 10 pounds in the past 2 months. She was seen in the emergency department on March 20 because of the symptoms.  There she was found to have a normal CBC (mild leukocytosis) and CMP.  A right upper quadrant ultrasound was negative for gallstones, ductal dilation or gallbladder wall thickening.  The ultrasound did show increased echogenicity of the liver consistent with fatty liver. Initial suspicion was for symptomatic cholelithiasis, but after the negative ultrasound, it was felt she was more likely to have peptic ulcer disease.  She was prescribed Protonix  at that time, but she did not take it for the first time until last night. She does report taking ibuprofen a few times a week for headaches.  She is to take 600 mg at a time.  Several years ago she used to take a lot of Advil and Mobic, but now she takes it less frequently She denies any family history of gallstones, peptic ulcer disease, celiac disease or inflammatory bowel disease.  She has never had an upper or lower endoscopy.  Past Medical History:  Diagnosis Date   Compression fracture 2004   2 lumbar fractures. Healed with brace   Depression    hx of - years ago   Gestational diabetes    on glyburide-dly   Infection    UTI   Mental disorder    hx - no meds   Pregnancy induced hypertension    on meds around 33-34wks   Sleep apnea    no cpap - lost 25 lbs since study     Past Surgical History:  Procedure Laterality Date   DILATATION & CURRETTAGE/HYSTEROSCOPY WITH RESECTOCOPE N/A 03/30/2015   Procedure: DILATATION & CURETTAGE/ HYSTEROSCOPY WITH MYOSURE;  Surgeon: Noland Fordyce, MD;  Location: [facility];  Service: Gynecology;  Laterality: N/A;  1 hr. would like myosure available.   DILATION AND CURETTAGE OF UTERUS     DILATION AND EVACUATION  07/27/2012   Procedure: DILATATION AND EVACUATION;  Surgeon: Alphonsus Sias. Ernestina Penna, MD;  Location: [facility];  Service: Gynecology;  Laterality: N/A;  with ultrasound   OPERATIVE ULTRASOUND N/A 03/30/2015   Procedure:  OPERATIVE ULTRASOUND. ;  Surgeon: Noland Fordyce, MD;  Location: [facility];  Service: Gynecology;  Laterality: N/A;   right knee surgery     acl x 2   Family History  Problem Relation Age of Onset   Hypertension Father    Hyperlipidemia Father    Diabetes Father    Obesity Father    Sleep apnea Father    Snoring Father    Mental retardation Sister    Thyroid disease Sister    Diabetes Maternal Grandmother    Diabetes Paternal Grandmother    Cancer Maternal Aunt    Lung cancer Maternal Aunt    Cancer Other    Social  History   Tobacco Use   Smoking status: Former    Packs/day: 0.50    Years: 4.00    Pack years: 2.00    Types: Cigarettes    Quit date: 01/26/2000    Years since quitting: 21.9   Smokeless tobacco: Never  Vaping Use   Vaping Use: Never used  Substance Use Topics   Alcohol use: Not Currently    Alcohol/week: 0.0 - 4.0 standard drinks    Comment: socially   Drug use: No   Current Outpatient Medications  Medication Sig Dispense Refill   BIOTIN PO Take by mouth.     MELATONIN PO Take by mouth.     Multiple Vitamin (MULTIVITAMIN PO) Take by mouth.     Omega-3 Fatty Acids (FISH OIL PO) Take 4 capsules by mouth daily.     pantoprazole (PROTONIX) 40 MG tablet Take 1 tablet (40 mg total) by mouth daily. 30 tablet 0   Probiotic Product (PROBIOTIC PO) Take by mouth.     TURMERIC PO Take 10 tablets by mouth daily.     No current facility-administered medications for this visit.   Allergies  Allergen Reactions   Hydrocodone Nausea And Vomiting     Review of Systems: All systems reviewed and negative except where noted in HPI.    US Abdomen Limited RUQ (LIVER/GB)  Result Date: 12/23/2021 CLINICAL DATA:  Postprandial abdominal pain for 2 weeks. EXAM: ULTRASOUND ABDOMEN LIMITED RIGHT UPPER QUADRANT COMPARISON:  Ultrasound complete abdomen 07/27/2018 FINDINGS: Gallbladder: No gallstones or wall thickening visualized. No sonographic Murphy sign noted by sonographer. Common bile duct: Diameter: 2.9 mm.  No intrahepatic biliary dilatation is seen. Liver: No focal lesion identified. The hepatic echogenicity is increased consistent with fatty replacement. Portal vein is patent on color Doppler imaging with normal direction of blood flow towards the liver. Other: No free fluid is seen. IMPRESSION: 1. No evidence of cholelithiasis, and no sonographic findings acute cholecystitis. 2. Increased hepatic echogenicity consistent with steatosis. Electronically Signed   By: Almira Bar M.D.   On:  12/23/2021 23:45    Physical Exam: There were no vitals taken for this visit. Constitutional: Pleasant,well-developed, Caucasian female in no acute distress. HEENT: Normocephalic and atraumatic. Conjunctivae are normal. No scleral icterus. Neck supple.  Cardiovascular: Normal rate, regular rhythm.  Pulmonary/chest: Effort normal and breath sounds normal. No wheezing, rales or rhonchi. Abdominal: Soft, nondistended, tenderness to palpation in the epigastrium, without rigidity or guarding. Bowel sounds active throughout. There are no masses palpable. No hepatomegaly. Extremities: no edema Neurological: Alert and oriented to person place and time. Skin: Skin is warm and dry. No rashes noted. Psychiatric: Normal mood and affect. Behavior is normal.  CBC  Component Value Date/Time   WBC 13.8 (H) 12/23/2021 1851   RBC 5.19 (H) 12/23/2021 1851   HGB 15.9 (H) 12/23/2021 1851   HCT 48.1 (H) 12/23/2021 1851   PLT 262 12/23/2021 1851   MCV 92.7 12/23/2021 1851   MCH 30.6 12/23/2021 1851   MCHC 33.1 12/23/2021 1851   RDW 12.2 12/23/2021 1851   LYMPHSABS 3.4 03/16/2021 1636   MONOABS 0.8 03/16/2021 1636   EOSABS 0.2 03/16/2021 1636   BASOSABS 0.1 03/16/2021 1636    CMP     Component Value Date/Time   NA 138 12/23/2021 1851   K 3.6 12/23/2021 1851   CL 104 12/23/2021 1851   CO2 23 12/23/2021 1851   GLUCOSE 88 12/23/2021 1851   BUN 17 12/23/2021 1851   CREATININE 0.74 12/23/2021 1851   CALCIUM 10.2 12/23/2021 1851   PROT 7.7 12/23/2021 1851   ALBUMIN 4.5 12/23/2021 1851   AST 21 12/23/2021 1851   ALT 13 12/23/2021 1851   ALKPHOS 48 12/23/2021 1851   BILITOT 0.4 12/23/2021 1851   GFRNONAA >60 12/23/2021 1851   GFRAA >90 03/18/2014 0515     ASSESSMENT AND PLAN: 46 year old female with 2 months of worsening postprandial epigastric pain and nausea with 10 pounds unintentional weight loss, with normal liver enzymes and no evidence of gallstones on right upper quadrant  ultrasound.  She does take NSAIDs regularly.  She just started taking PPI last night.  Given negative evaluation for gallstones, peptic ulcer disease is high on the differential.  We will plan for expedited upper endoscopy to assess for peptic ulcer disease.  Recommended she increase her Protonix to twice daily.  I told her to stop taking all NSAIDs at this time and to use Tylenol as needed for headaches.  We will take biopsies for H. pylori and celiac disease at the time of her upper endoscopy.  If EGD is unremarkable, I would reevaluate for biliary dyskinesia with HIDA scan.  Epigastric pain - EGD - Increase Protonix to BID - Avoid NSAIDs, use Tylenol PRN for headaches - HIDA scan if EGD negative  The details, risks (including bleeding, perforation, infection, missed lesions, medication reactions and possible hospitalization or surgery if complications occur), benefits, and alternatives to EGD with possible biopsy and possible dilation were discussed with the patient and she consents to proceed.   Corion Sherrod E. Tomasa Rand, MD [facility]   CC:  Merri Brunette, MD

## 2022-01-01 ENCOUNTER — Other Ambulatory Visit: Payer: Self-pay

## 2022-01-01 ENCOUNTER — Encounter: Payer: Self-pay | Admitting: Gastroenterology

## 2022-01-01 ENCOUNTER — Ambulatory Visit (AMBULATORY_SURGERY_CENTER): Payer: No Typology Code available for payment source | Admitting: Gastroenterology

## 2022-01-01 VITALS — BP 113/69 | HR 67 | Temp 97.5°F | Resp 12 | Ht 68.0 in | Wt 206.0 lb

## 2022-01-01 DIAGNOSIS — K319 Disease of stomach and duodenum, unspecified: Secondary | ICD-10-CM | POA: Diagnosis not present

## 2022-01-01 DIAGNOSIS — R1013 Epigastric pain: Secondary | ICD-10-CM | POA: Diagnosis present

## 2022-01-01 MED ORDER — DICYCLOMINE HCL 20 MG PO TABS: 20.0000 mg | ORAL_TABLET | Freq: Three times a day (TID) | ORAL | 1 refills | Status: DC | PRN

## 2022-01-01 MED ORDER — PANTOPRAZOLE SODIUM 40 MG PO TBEC: 40.0000 mg | DELAYED_RELEASE_TABLET | Freq: Every day | ORAL | 6 refills | Status: DC

## 2022-01-01 MED ORDER — SODIUM CHLORIDE 0.9 % IV SOLN: 500.0000 mL | Freq: Once | INTRAVENOUS | Status: DC

## 2022-01-01 MED ORDER — PANTOPRAZOLE SODIUM 40 MG PO TBEC: 40.0000 mg | DELAYED_RELEASE_TABLET | Freq: Every day | ORAL | 0 refills | Status: DC

## 2022-01-01 NOTE — Op Note (Signed)
[facility] ?Patient Name: Pamela NeighborsKimberly Marsh ?Procedure Date: 01/01/2022 10:13 AM ?MRN: [id number] ?Endoscopist: Daveah Varone E. Tomasa Randunningham , MD ?Age: [age] ?Referring MD:  ?Date of Birth: 12-15-1975 ?Gender: Female ?Account #: [id number] ?Procedure:                Upper GI endoscopy ?Indications:              Abdominal pain in the right upper quadrant ?Medicines:                Monitored Anesthesia Care ?Procedure:                Pre-Anesthesia Assessment: ?                          - Prior to the procedure, a History and Physical  ?                          was performed, and patient medications and  ?                          allergies were reviewed. The patient's tolerance of  ?                          previous anesthesia was also reviewed. The risks  ?                          and benefits of the procedure and the sedation  ?                          options and risks were discussed with the patient.  ?                          All questions were answered, and informed consent  ?                          was obtained. Prior Anticoagulants: The patient has  ?                          taken no previous anticoagulant or antiplatelet  ?                          agents. ASA Grade Assessment: II - A patient with  ?                          mild systemic disease. After reviewing the risks  ?                          and benefits, the patient was deemed in  ?                          satisfactory condition to undergo the procedure. ?                          After obtaining informed consent, the endoscope was  ?  passed under direct vision. Throughout the  ?                          procedure, the patient's blood pressure, pulse, and  ?                          oxygen saturations were monitored continuously. The  ?                          Endoscope was introduced through the mouth, and  ?                          advanced to the third part of duodenum. The upper  ?                          GI  endoscopy was accomplished without difficulty.  ?                          The patient tolerated the procedure well. ?Scope In: ?Scope Out: ?Findings:                 The examined portions of the nasopharynx,  ?                          oropharynx and larynx were normal. ?                          The examined esophagus was normal. ?                          A 4 cm hiatal hernia was present. ?                          Multiple localized erosions with stigmata of recent  ?                          bleeding were found in the proximal gastric body.  ?                          Biopsies were taken with a cold forceps for  ?                          Helicobacter pylori testing from the antrum as well  ?                          as the erosions in the body. ?                          The exam of the stomach was otherwise normal. ?                          The examined duodenum was normal. ?Complications:            No immediate complications. ?Estimated Blood Loss:     Estimated blood loss was minimal. ?Impression:               -  The examined portions of the nasopharynx,  ?                          oropharynx and larynx were normal. ?                          - Normal esophagus. ?                          - 4 cm hiatal hernia. ?                          - Gastric erosions with stigmata of recent  ?                          bleeding. Biopsied. ?                          - Normal examined duodenum. ?Recommendation:           - Patient has a contact number available for  ?                          emergencies. The signs and symptoms of potential  ?                          delayed complications were discussed with the  ?                          patient. Return to normal activities tomorrow.  ?                          Written discharge instructions were provided to the  ?                          patient. ?                          - Resume previous diet. ?                          - Continue present medications. Okay to  decrease  ?                          Protonix to once daily if desired. ?                          - Await pathology results. ?                          - Follow up in 2-4 weeks. Consider HIDA scan to  ?                          further evaluate for gallbladder dyskinesia if  ?                          having persistent symptoms. ?Lakeyta Vandenheuvel E. Tomasa Rand,  MD ?01/01/2022 10:43:04 AM ?This report has been signed electronically. ?

## 2022-01-01 NOTE — Progress Notes (Signed)
VS by DT    

## 2022-01-01 NOTE — Progress Notes (Signed)
Report to PACU, RN, vss, BBS= Clear.  

## 2022-01-01 NOTE — Progress Notes (Signed)
History and Physical Interval Note: ? ?01/01/2022 ?10:22 AM ? ?Pamela Marsh  has presented today for endoscopic procedure(s), with the diagnosis of  ?Encounter Diagnosis  ?Name Primary?  ? Abdominal pain, epigastric Yes  ?Marland Kitchen  The various methods of evaluation and treatment have been discussed with the patient and/or family. After consideration of risks, benefits and other options for treatment, the patient has consented to  the endoscopic procedure(s). ? ? The patient's history has been reviewed, patient examined, no change in status, stable for endoscopic procedure(s).  I have reviewed the patient's chart and labs.  Questions were answered to the patient's satisfaction.   ? ? ?Jahking Lesser E. Candis Schatz, MD ?[facility] ? ?

## 2022-01-01 NOTE — Patient Instructions (Signed)
Handout was given to your care partner on a Hiatal Hernia. ?A prescription for Bentyl was sent to [facility] to be picked up for abdominal pain. ?Okay to Decrease PROTONIX to once daily if desired. ?You may resume your current medications today. ?Await biopsy results.  May take 1-3 weeks to receive pathology results. ?The office will call you to schedule a HIDA scan per Dr. Tomasa Rand. ?Please call if any questions or concerns. ? ? ? ?YOU HAD AN ENDOSCOPIC PROCEDURE TODAY AT THE [facility]:   Refer to the procedure report that was given to you for any specific questions about what was found during the examination.  If the procedure report does not answer your questions, please call your gastroenterologist to clarify.  If you requested that your care partner not be given the details of your procedure findings, then the procedure report has been included in a sealed envelope for you to review at your convenience later. ? ?YOU SHOULD EXPECT: Some feelings of bloating in the abdomen. Passage of more gas than usual.  Walking can help get rid of the air that was put into your GI tract during the procedure and reduce the bloating. If you had a lower endoscopy (such as a colonoscopy or flexible sigmoidoscopy) you may notice spotting of blood in your stool or on the toilet paper. If you underwent a bowel prep for your procedure, you may not have a normal bowel movement for a few days. ? ?Please Note:  You might notice some irritation and congestion in your nose or some drainage.  This is from the oxygen used during your procedure.  There is no need for concern and it should clear up in a day or so. ? ?SYMPTOMS TO REPORT IMMEDIATELY: ? ? ?Following upper endoscopy (EGD) ? Vomiting of blood or coffee ground material ? New chest pain or pain under the shoulder blades ? Painful or persistently difficult swallowing ? New shortness of breath ? Fever of 100?F or higher ? Black, tarry-looking stools ? ?For urgent or  emergent issues, a gastroenterologist can be reached at any hour by calling [phone]. ?Do not use MyChart messaging for urgent concerns.  ? ? ?DIET:  We do recommend a small meal at first, but then you may proceed to your regular diet.  Drink plenty of fluids but you should avoid alcoholic beverages for 24 hours. ? ?ACTIVITY:  You should plan to take it easy for the rest of today and you should NOT DRIVE or use heavy machinery until tomorrow (because of the sedation medicines used during the test).   ? ?FOLLOW UP: ?Our staff will call the number listed on your records 48-72 hours following your procedure to check on you and address any questions or concerns that you may have regarding the information given to you following your procedure. If we do not reach you, we will leave a message.  We will attempt to reach you two times.  During this call, we will ask if you have developed any symptoms of COVID 19. If you develop any symptoms (ie: fever, flu-like symptoms, shortness of breath, cough etc.) before then, please call [phone].  If you test positive for Covid 19 in the 2 weeks post procedure, please call and report this information to [place].   ? ?If any biopsies were taken you will be contacted by phone or by letter within the next 1-3 weeks.  Please call us at [phone] if you have not heard about the  biopsies in 3 weeks.  ? ? ?SIGNATURES/CONFIDENTIALITY: ?You and/or your care partner have signed paperwork which will be entered into your electronic medical record.  These signatures attest to the fact that that the information above on your After Visit Summary has been reviewed and is understood.  Full responsibility of the confidentiality of this discharge information lies with you and/or your care-partner.  ?  ?

## 2022-01-01 NOTE — Progress Notes (Signed)
Called to room to assist during endoscopic procedure.  Patient ID and intended procedure confirmed with present staff. Received instructions for my participation in the procedure from the performing physician.  

## 2022-01-01 NOTE — Progress Notes (Signed)
Pt request a prescription for PANTOPRAZOLE 40 mg 1 daily #30 refill x6 was sent to [facility] Also a new Rx was sent in for Bentyl #30 1 po q8h prn abd pain #30 refill x1 to [facility]. ? ?No problems noted in the recovery room. maw  ?

## 2022-01-02 ENCOUNTER — Other Ambulatory Visit: Payer: Self-pay

## 2022-01-03 ENCOUNTER — Telehealth: Payer: Self-pay | Admitting: *Deleted

## 2022-01-03 ENCOUNTER — Telehealth: Payer: Self-pay | Admitting: Gastroenterology

## 2022-01-03 DIAGNOSIS — R1011 Right upper quadrant pain: Secondary | ICD-10-CM

## 2022-01-03 DIAGNOSIS — R1013 Epigastric pain: Secondary | ICD-10-CM

## 2022-01-03 NOTE — Telephone Encounter (Signed)
Daryel November, MD  Algernon Huxley, RN ?Vaughan Basta,  ?Can you please order a HIDA scan on this patient, and schedule a follow up clinic visit with me following completion of the HIDA study?  ? ?Thanks,  ?Dr. Loletha Grayer  ?

## 2022-01-03 NOTE — Telephone Encounter (Signed)
?  Follow up Call- ? ? ?  01/01/2022  ?  9:36 AM  ?Call back number  ?Post procedure Call Back phone  # [phone]  ?Permission to leave phone message Yes  ?  ? ?Patient questions: ? ?Do you have a fever, pain , or abdominal swelling? No. ?Pain Score  0 * ? ?Have you tolerated food without any problems? Yes.   ? ?Have you been able to return to your normal activities? Yes.   ? ?Do you have any questions about your discharge instructions: ?Diet   No. ?Medications  No. ?Follow up visit  No. ? ?Do you have questions or concerns about your Care? No. ? ?Actions: ?* If pain score is 4 or above: ?No action needed, pain <4. ? ? ?

## 2022-01-03 NOTE — Telephone Encounter (Signed)
Patient called requesting results of her recent procedure and also questioning about scheduling a HIDA scan.  Please call patient and advise.  Thank you. ?

## 2022-01-03 NOTE — Telephone Encounter (Signed)
The pt has been advised that the results have not been reviewed.  We will contact her as soon as available. She will wait for those results to discuss HIDA ?

## 2022-01-03 NOTE — Telephone Encounter (Signed)
Attempted to call patient for their post-procedure follow-up call. No answer. Left voicemail.   

## 2022-01-06 NOTE — Progress Notes (Signed)
Ms. Penning,  ?The biopsies taken from your stomach were notable for mild reactive gastropathy which is a common finding and often related to use of certain medications (often NSAIDs), but there was no evidence of Helicobacter pylori infection. This common finding is not felt to necessarily be a cause of any particular symptom and there is no specific treatment or further evaluation recommended. ?Please follow up with me in the clinic after your HIDA scan is complete. ? ?Maya,  ?Can you please book a follow up clinic appointment with sometime after her HIDA scan? ? ?Thanks ?

## 2022-01-06 NOTE — Addendum Note (Signed)
Addended by: Missy Sabins on: 01/06/2022 12:32 PM ? ? Modules accepted: Orders ? ?

## 2022-01-06 NOTE — Telephone Encounter (Signed)
Patient called this morning and just wanted to let you know that she will be leaving Thursday to go out of the country and wanted to get the HIDA scan at least on the books before she left.  Please call patient and advise.  Thank you. ?

## 2022-01-06 NOTE — Telephone Encounter (Signed)
Returned call to patient. She was under the impression that HIDA scan was already ordered. I told her that the order was not previously placed because she wanted pathology results prior, pt states that is not what she said. I told her that I placed the order for the HIDA scan today and she can call radiology scheduling to set up her appt at her convenience. Pt verbalized understanding and had no concerns at the end of the call.  ?

## 2022-01-06 NOTE — Telephone Encounter (Signed)
Patient is returning your call.  

## 2022-01-06 NOTE — Telephone Encounter (Signed)
Lm on vm for patient to return call.  ? ?Pathology has not been reviewed yet. HIDA scan order is in epic. ?

## 2022-01-20 ENCOUNTER — Ambulatory Visit (HOSPITAL_COMMUNITY)
Admission: RE | Admit: 2022-01-20 | Discharge: 2022-01-20 | Disposition: A | Discharge status: Home / Self Care | Payer: No Typology Code available for payment source | Source: Ambulatory Visit | Attending: Gastroenterology | Admitting: Gastroenterology

## 2022-01-20 DIAGNOSIS — R1011 Right upper quadrant pain: Secondary | ICD-10-CM | POA: Diagnosis present

## 2022-01-20 DIAGNOSIS — R1013 Epigastric pain: Secondary | ICD-10-CM | POA: Insufficient documentation

## 2022-01-20 MED ORDER — TECHNETIUM TC 99M MEBROFENIN IV KIT
5.2000 | PACK | Freq: Once | INTRAVENOUS | Status: AC | PRN
  Administered 2022-01-20: 5.2 via INTRAVENOUS

## 2022-01-23 NOTE — Progress Notes (Signed)
Pamela Marsh,  ?Your HIDA scan was normal, with normal flow of bile and normal gallbladder ejection fraction.  It is unlikely that her gallbladder is causing your symptoms.  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  Please follow-up with me in clinic to discuss this disorder more in depth and to discuss possible treatment options.

## 2022-01-28 ENCOUNTER — Other Ambulatory Visit: Payer: No Typology Code available for payment source

## 2022-01-28 ENCOUNTER — Encounter: Payer: Self-pay | Admitting: Gastroenterology

## 2022-01-28 ENCOUNTER — Ambulatory Visit (INDEPENDENT_AMBULATORY_CARE_PROVIDER_SITE_OTHER): Payer: No Typology Code available for payment source | Admitting: Gastroenterology

## 2022-01-28 VITALS — BP 126/82 | HR 94 | Ht 68.0 in | Wt 203.8 lb

## 2022-01-28 DIAGNOSIS — Z1212 Encounter for screening for malignant neoplasm of rectum: Secondary | ICD-10-CM | POA: Diagnosis not present

## 2022-01-28 DIAGNOSIS — R1013 Epigastric pain: Secondary | ICD-10-CM

## 2022-01-28 DIAGNOSIS — Z1211 Encounter for screening for malignant neoplasm of colon: Secondary | ICD-10-CM

## 2022-01-28 MED ORDER — DICYCLOMINE HCL 20 MG PO TABS: 20.0000 mg | ORAL_TABLET | Freq: Three times a day (TID) | ORAL | 3 refills | Status: DC | PRN

## 2022-01-28 MED ORDER — SUTAB 1479-225-188 MG PO TABS: 1.0000 | ORAL_TABLET | Freq: Once | ORAL | 0 refills | Status: AC

## 2022-01-28 NOTE — Patient Instructions (Addendum)
If you are age 45 or older, your body mass index should be between 23-30. Your Body mass index is 30.99 kg/m?Marland Kitchen If this is out of the aforementioned range listed, please consider follow up with your Primary Care Provider. ? ?If you are age 76 or younger, your body mass index should be between 19-25. Your Body mass index is 30.99 kg/m?Marland Kitchen If this is out of the aformentioned range listed, please consider follow up with your Primary Care Provider.  ? ?You have been scheduled for a colonoscopy. Please follow written instructions given to you at your visit today.  ?Please pick up your prep supplies at the pharmacy within the next 1-3 days. ?If you use inhalers (even only as needed), please bring them with you on the day of your procedure.  ? ?Stop Protonix. ? ?Your provider has requested that you go to the basement level for lab work before leaving today. Press "B" on the elevator. The lab is located at the first door on the left as you exit the elevator. ? ? ?The [facility] providers would like to encourage you to use [facility] to communicate with providers for non-urgent requests or questions.  Due to long hold times on the telephone, sending your provider a message by [facility] may be a faster and more efficient way to get a response.  Please allow 48 business hours for a response.  Please remember that this is for non-urgent requests.  ? ?Due to recent changes in healthcare laws, you may see the results of your imaging and laboratory studies on MyChart before your provider has had a chance to review them.  We understand that in some cases there may be results that are confusing or concerning to you. Not all laboratory results come back in the same time frame and the provider may be waiting for multiple results in order to interpret others.  Please give [place] 48 hours in order for your provider to thoroughly review all the results before contacting the office for clarification of your results.  ? ?It was a pleasure to see you  today! ? ?Thank you for trusting me with your gastrointestinal care!   ? ?Scott E.Candis Schatz, MD  ?

## 2022-01-28 NOTE — Progress Notes (Signed)
? ?HPI : Pamela Marsh is a very pleasant 46 year old female who I initially saw March 22 with complaints of epigastric pain.  She had an ultrasound in the ER which was unremarkable.  An upper endoscopy a week later was also unremarkable.  We then performed a HIDA scan to further assess for biliary dyskinesia which was also unremarkable.   ?She was started on Protonix and Bentyl and reports that her symptoms are improved with these medications.  She has tried not taking the medications, and when she does not take them for a few days, her pain is worsened.  Not having any problems with nausea or vomiting.  Her bowel movements have been daily, but vary in consistency, and are frequently loose and poorly formed.  Color of her stool has varied.  No blood.  Her weight has been stable overall. ? ? ?Past Medical History:  ?Diagnosis Date  ? Chronic headaches   ? Compression fracture 10/06/2002  ? 2 lumbar fractures. Healed with brace  ? Depression   ? hx of - years ago  ? Gestational diabetes   ? on glyburide-dly  ? Hyperlipidemia   ? Infection   ? UTI  ? Mental disorder   ? hx - no meds  ? Obesity   ? Osteoarthritis   ? Postpartum anxiety   ? Pregnancy induced hypertension   ? on meds around 33-34wks  ? ? ? ?Past Surgical History:  ?Procedure Laterality Date  ? DILATATION & CURRETTAGE/HYSTEROSCOPY WITH RESECTOCOPE N/A 03/30/2015  ? Procedure: DILATATION & CURETTAGE/ HYSTEROSCOPY WITH MYOSURE;  Surgeon: Noland Fordyce, MD;  Location: [facility];  Service: Gynecology;  Laterality: N/A;  1 hr. would like myosure available.  ? DILATION AND CURETTAGE OF UTERUS    ? DILATION AND EVACUATION  07/27/2012  ? Procedure: DILATATION AND EVACUATION;  Surgeon: Alphonsus Sias. Ernestina Penna, MD;  Location: [facility];  Service: Gynecology;  Laterality: N/A;  with ultrasound  ? OPERATIVE ULTRASOUND N/A 03/30/2015  ? Procedure:  OPERATIVE ULTRASOUND. ;  Surgeon: Noland Fordyce, MD;  Location: [facility];  Service: Gynecology;  Laterality: N/A;  ? right knee  surgery    ? acl x 2  ? ?Family History  ?Problem Relation Age of Onset  ? Hypertension Mother   ? Alzheimer's disease Mother   ? Arthritis Mother   ? Hypertension Father   ? Hyperlipidemia Father   ? Diabetes Father   ? Obesity Father   ? Sleep apnea Father   ? Snoring Father   ? Mental retardation Sister   ? Thyroid disease Sister   ? Diabetes Sister   ? Hyperlipidemia Sister   ? Thyroid disease Sister   ? Alcoholism Sister   ? Diabetes Maternal Grandmother   ? Diabetes Paternal Grandmother   ? Cancer Maternal Aunt   ? Lung cancer Maternal Aunt   ? Cancer Other   ? Colon cancer Neg Hx   ? Esophageal cancer Neg Hx   ? Pancreatic cancer Neg Hx   ? Colon polyps Neg Hx   ? ?Social History  ? ?Tobacco Use  ? Smoking status: Former  ?  Packs/day: 0.50  ?  Years: 4.00  ?  Pack years: 2.00  ?  Types: Cigarettes  ?  Quit date: 01/26/2000  ?  Years since quitting: 22.0  ? Smokeless tobacco: Never  ?Vaping Use  ? Vaping Use: Never used  ?Substance Use Topics  ? Alcohol use: Yes  ?  Alcohol/week: 0.0 - 4.0 standard drinks  ?  Comment: socially  ? Drug use: No  ? ?Current Outpatient Medications  ?Medication Sig Dispense Refill  ? dicyclomine (BENTYL) 20 MG tablet Take 1 tablet (20 mg total) by mouth every 8 (eight) hours as needed for spasms. 30 tablet 1  ? MELATONIN PO Take by mouth.    ? pantoprazole (PROTONIX) 40 MG tablet Take 1 tablet (40 mg total) by mouth daily. 30 tablet 6  ? Probiotic Product (PROBIOTIC PO) Take by mouth.    ? ?No current facility-administered medications for this visit.  ? ?Allergies  ?Allergen Reactions  ? Hydrocodone Nausea And Vomiting  ? ? ? ?Review of Systems: ?All systems reviewed and negative except where noted in HPI.  ? ? ?NM Hepato W/EjeCT Fract ? ?Result Date: 01/20/2022 ?CLINICAL DATA:  Epigastric and right upper quadrant abdominal pain. EXAM: NUCLEAR MEDICINE HEPATOBILIARY IMAGING WITH GALLBLADDER EF TECHNIQUE: Sequential images of the abdomen were obtained out to 60 minutes following  intravenous administration of radiopharmaceutical. After oral ingestion of Ensure, gallbladder ejection fraction was determined. At 60 min, normal ejection fraction is greater than 33%. RADIOPHARMACEUTICALS:  5.2 mCi Tc-49m  Choletec IV COMPARISON:  Abdominal ultrasound December 23, 2021 FINDINGS: There is prompt, uniform radiotracer uptake by the liver with normal filling of the intrahepatic ducts, common bile duct. Gallbladder activity is visualized, consistent with patency of cystic duct (normal < 60 minutes). Additionally there is normal biliary to bowel transit (normal < 60 minutes), consistent with patent common bile duct. Ensure was administered and the gallbladder appears to empty normally on sequential images. Calculated gallbladder ejection fraction is 83%. (Normal gallbladder ejection fraction with Ensure is greater than 33%.) No evidence of enterogastric biliary reflux. IMPRESSION: 1.  Patent cystic and common bile ducts. 2.  Normal gallbladder ejection fraction. Electronically Signed   By: Maudry Mayhew M.D.   On: 01/20/2022 14:29   ? ?Physical Exam: ?Ht 5\' 8"  (1.727 m)   Wt 203 lb 12.8 oz (92.4 kg)   LMP 01/20/2022 (Exact Date)   BMI 30.99 kg/m?  ?Constitutional: Pleasant,well-developed, Caucasian female in no acute distress. ?HEENT: Normocephalic and atraumatic. Conjunctivae are normal. No scleral icterus. ?Extremities: no edema ?Neurological: Alert and oriented to person place and time. ?Skin: Skin is warm and dry. No rashes noted. ?Psychiatric: Normal mood and affect. Behavior is normal. ? ?CBC ?   ?Component Value Date/Time  ? WBC 13.8 (H) 12/23/2021 1851  ? RBC 5.19 (H) 12/23/2021 1851  ? HGB 15.9 (H) 12/23/2021 1851  ? HCT 48.1 (H) 12/23/2021 1851  ? PLT 262 12/23/2021 1851  ? MCV 92.7 12/23/2021 1851  ? MCH 30.6 12/23/2021 1851  ? MCHC 33.1 12/23/2021 1851  ? RDW 12.2 12/23/2021 1851  ? LYMPHSABS 3.4 03/16/2021 1636  ? MONOABS 0.8 03/16/2021 1636  ? EOSABS 0.2 03/16/2021 1636  ? BASOSABS 0.1  03/16/2021 1636  ? ? ?CMP  ?   ?Component Value Date/Time  ? NA 138 12/23/2021 1851  ? K 3.6 12/23/2021 1851  ? CL 104 12/23/2021 1851  ? CO2 23 12/23/2021 1851  ? GLUCOSE 88 12/23/2021 1851  ? BUN 17 12/23/2021 1851  ? CREATININE 0.74 12/23/2021 1851  ? CALCIUM 10.2 12/23/2021 1851  ? PROT 7.7 12/23/2021 1851  ? ALBUMIN 4.5 12/23/2021 1851  ? AST 21 12/23/2021 1851  ? ALT 13 12/23/2021 1851  ? ALKPHOS 48 12/23/2021 1851  ? BILITOT 0.4 12/23/2021 1851  ? GFRNONAA >60 12/23/2021 1851  ? GFRAA >90 03/18/2014 0515  ? ? ? ?ASSESSMENT AND  PLAN: ?46 year old female with several months of upper abdominal pain worsened with eating, originally suspicious for biliary colic or peptic ulcer disease, but with normal RUQUS, HIDA and EGD.  She has had clinical improvement with Protonix and Bentyl (started at the same time). 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   ?We discussed the proposed pathophysiology of IBS and gut brain axis disorders in general.  We discussed management of GBADs, to include use of medications to improve abdominal pain, centrally acting neuromodulators, role of empiric dietary modifications to include a low FODMAP diet ,gluten-free diet, as well as the role of cognitive therapies.  We discussed the goals of GBAD management, namely to minimize the impact of GI symptoms on quality of life.   ?Will plan to continue bentyl for now, as it is helping.  I suggested she try stopping the Protonix.  Will evaluate for celiac disease with Ttg/IgA level. ?She turns 46 in July and will be due for her initial average risk screening colonoscopy. ? ?Abdominal pain, epigastric ?- TTG/IgA ?- Stop Protonix ?- Continue Bentyl PRN ? ?Colon cancer screening ?- Colonoscopy ? ?The details, risks (including bleeding, perforation, infection, missed lesions, medication reactions and  possible hospitalization or surgery if complications occur), benefits, and alternatives to colonoscopy with possible biopsy and possible polypectomy were discussed with the patient and she consents to proceed.

## 2022-01-29 LAB — IGA: Immunoglobulin A: 236 mg/dL (ref 47–310)

## 2022-01-29 LAB — TISSUE TRANSGLUTAMINASE, IGA: (tTG) Ab, IgA: <1 U/mL

## 2022-01-30 NOTE — Progress Notes (Signed)
Kim,  ?Your test for celiac disease was negative.  See you in a few weeks for your colonoscopy.

## 2022-02-11 ENCOUNTER — Encounter: Payer: No Typology Code available for payment source | Admitting: Gastroenterology

## 2022-02-20 ENCOUNTER — Encounter: Payer: Self-pay | Admitting: Gastroenterology

## 2022-02-20 ENCOUNTER — Ambulatory Visit (AMBULATORY_SURGERY_CENTER): Payer: No Typology Code available for payment source | Admitting: Gastroenterology

## 2022-02-20 VITALS — BP 100/50 | HR 82 | Temp 98.7°F | Resp 11 | Ht 68.0 in | Wt 203.0 lb

## 2022-02-20 DIAGNOSIS — Z1211 Encounter for screening for malignant neoplasm of colon: Secondary | ICD-10-CM

## 2022-02-20 DIAGNOSIS — Z1212 Encounter for screening for malignant neoplasm of rectum: Secondary | ICD-10-CM | POA: Diagnosis not present

## 2022-02-20 MED ORDER — SODIUM CHLORIDE 0.9 % IV SOLN: 500.0000 mL | Freq: Once | INTRAVENOUS | Status: DC

## 2022-02-20 NOTE — Progress Notes (Signed)
History and Physical Interval Note:  02/20/2022 3:49 PM  Pamela Marsh  has presented today for endoscopic procedure(s), with the diagnosis of  Encounter Diagnosis  Name Primary?   Screening for colorectal cancer Yes  .  The various methods of evaluation and treatment have been discussed with the patient and/or family. After consideration of risks, benefits and other options for treatment, the patient has consented to  the endoscopic procedure(s).   The patient's history has been reviewed, patient examined, no change in status, stable for endoscopic procedure(s).  I have reviewed the patient's chart and labs.  Questions were answered to the patient's satisfaction.     Keiston Manley E. Tomasa Rand, MD [facility]

## 2022-02-20 NOTE — Progress Notes (Signed)
Report to pacu rn; vss. ?

## 2022-02-20 NOTE — Patient Instructions (Signed)
Thank you for coming in to see [place] today! Resume previous diet and medications today. Return to normal daily activities tomorrow. Recommend next surveillance colonoscopy in 10 years.   YOU HAD AN ENDOSCOPIC PROCEDURE TODAY AT THE [facility]:   Refer to the procedure report that was given to you for any specific questions about what was found during the examination.  If the procedure report does not answer your questions, please call your gastroenterologist to clarify.  If you requested that your care partner not be given the details of your procedure findings, then the procedure report has been included in a sealed envelope for you to review at your convenience later.  YOU SHOULD EXPECT: Some feelings of bloating in the abdomen. Passage of more gas than usual.  Walking can help get rid of the air that was put into your GI tract during the procedure and reduce the bloating. If you had a lower endoscopy (such as a colonoscopy or flexible sigmoidoscopy) you may notice spotting of blood in your stool or on the toilet paper. If you underwent a bowel prep for your procedure, you may not have a normal bowel movement for a few days.  Please Note:  You might notice some irritation and congestion in your nose or some drainage.  This is from the oxygen used during your procedure.  There is no need for concern and it should clear up in a day or so.  SYMPTOMS TO REPORT IMMEDIATELY:  Following lower endoscopy (colonoscopy or flexible sigmoidoscopy):  Excessive amounts of blood in the stool  Significant tenderness or worsening of abdominal pains  Swelling of the abdomen that is new, acute  Fever of 100F or higher    For urgent or emergent issues, a gastroenterologist can be reached at any hour by calling [phone]. Do not use MyChart messaging for urgent concerns.    DIET:  We do recommend a small meal at first, but then you may proceed to your regular diet.  Drink plenty of fluids but  you should avoid alcoholic beverages for 24 hours.  ACTIVITY:  You should plan to take it easy for the rest of today and you should NOT DRIVE or use heavy machinery until tomorrow (because of the sedation medicines used during the test).    FOLLOW UP: Our staff will call the number listed on your records 48-72 hours following your procedure to check on you and address any questions or concerns that you may have regarding the information given to you following your procedure. If we do not reach you, we will leave a message.  We will attempt to reach you two times.  During this call, we will ask if you have developed any symptoms of COVID 19. If you develop any symptoms (ie: fever, flu-like symptoms, shortness of breath, cough etc.) before then, please call [phone].  If you test positive for Covid 19 in the 2 weeks post procedure, please call and report this information to [place].    If any biopsies were taken you will be contacted by phone or by letter within the next 1-3 weeks.  Please call us at [phone] if you have not heard about the biopsies in 3 weeks.    SIGNATURES/CONFIDENTIALITY: You and/or your care partner have signed paperwork which will be entered into your electronic medical record.  These signatures attest to the fact that that the information above on your After Visit Summary has been reviewed and is understood.  Full responsibility of  the confidentiality of this discharge information lies with you and/or your care-partner.

## 2022-02-20 NOTE — Op Note (Signed)
[facility] Patient Name: Pamela Marsh Procedure Date: 02/20/2022 3:33 PM MRN: [id number] Endoscopist: Nicki Reaper E. Candis Schatz , MD Age: 46 Referring MD:  Date of Birth: 07/04/1976 Gender: Female Account #: [id number] Procedure:                Colonoscopy Indications:              Screening for colorectal malignant neoplasm, This                            is the patient's first colonoscopy Medicines:                Monitored Anesthesia Care Procedure:                Pre-Anesthesia Assessment:                           - Prior to the procedure, a History and Physical                            was performed, and patient medications and                            allergies were reviewed. The patient's tolerance of                            previous anesthesia was also reviewed. The risks                            and benefits of the procedure and the sedation                            options and risks were discussed with the patient.                            All questions were answered, and informed consent                            was obtained. Prior Anticoagulants: The patient has                            taken no previous anticoagulant or antiplatelet                            agents. ASA Grade Assessment: I - A normal, healthy                            patient. After reviewing the risks and benefits,                            the patient was deemed in satisfactory condition to                            undergo the procedure.  After obtaining informed consent, the colonoscope                            was passed under direct vision. Throughout the                            procedure, the patient's blood pressure, pulse, and                            oxygen saturations were monitored continuously. The                            Olympus CF-HQ190L [id number]) Colonoscope was                            introduced through the anus and  advanced to the the                            terminal ileum, with identification of the                            appendiceal orifice and IC valve. The colonoscopy                            was performed without difficulty. The patient                            tolerated the procedure well. The quality of the                            bowel preparation was adequate. The terminal ileum,                            ileocecal valve, appendiceal orifice, and rectum                            were photographed. The bowel preparation used was                            Sutab via split dose instruction. Scope In: 4:00:01 PM Scope Out: 4:12:30 PM Scope Withdrawal Time: 0 hours 10 minutes 22 seconds  Total Procedure Duration: 0 hours 12 minutes 29 seconds  Findings:                 Skin tags were found on perianal exam.                           The digital rectal exam was normal. Pertinent                            negatives include normal sphincter tone and no                            palpable rectal lesions.  The colon (entire examined portion) appeared normal.                           A single small angioectasia without bleeding was                            found in the descending colon.                           The retroflexed view of the distal rectum and anal                            verge was normal and showed no anal or rectal                            abnormalities.                           The terminal ileum appeared normal. Complications:            No immediate complications. Estimated Blood Loss:     Estimated blood loss: none. Impression:               - Perianal skin tags found on perianal exam.                           - The entire examined colon is normal.                           - A single non-bleeding colonic angioectasia.                           - The distal rectum and anal verge are normal on                             retroflexion view.                           - The examined portion of the ileum was normal.                           - No specimens collected. Recommendation:           - Patient has a contact number available for                            emergencies. The signs and symptoms of potential                            delayed complications were discussed with the                            patient. Return to normal activities tomorrow.                            Written discharge instructions were provided to the  patient.                           - Resume previous diet.                           - Continue present medications.                           - Repeat colonoscopy in 10 years for screening                            purposes. Millard Bautch E. Candis Schatz, MD 02/20/2022 4:17:33 PM This report has been signed electronically.

## 2022-02-21 ENCOUNTER — Telehealth: Payer: Self-pay

## 2022-02-21 NOTE — Telephone Encounter (Signed)
  Follow up Call-     02/20/2022    3:13 PM 01/01/2022    9:36 AM  Call back number  Post procedure Call Back phone  # [phone]  Permission to leave phone message Yes Yes     Patient questions:  Do you have a fever, pain , or abdominal swelling? No. Pain Score  0 *  Have you tolerated food without any problems? Yes.    Have you been able to return to your normal activities? Yes.    Do you have any questions about your discharge instructions: Diet   No. Medications  No. Follow up visit  No.  Do you have questions or concerns about your Care? No.  Actions: * If pain score is 4 or above: No action needed, pain <4.

## 2024-03-22 ENCOUNTER — Encounter

## 2024-03-22 ENCOUNTER — Other Ambulatory Visit

## 2024-03-29 NOTE — Progress Notes (Unsigned)
 [facility] CONSULT NOTE  Patient Care Team: Clarice Nottingham, MD as PCP - General (Internal Medicine)  ASSESSMENT & PLAN 48 y.o.female with history of hypothyroidism, HLD, overweight, referred to [facility] for elevated hemoglobin.  The etiology is not clear at this time and requires further testing and investigation. Discussed differential diagnosis of polycythemia. Her numbers are borderline. No thrombocytosis. Clinically well. Some numbness in the fingers and toes.  She has monthly menstrual cycles. She used to donate blood more often than recently. Given these history, will also rule out primary cause of polycythemia. Also should follow up with sleep apnea evaluation as planned.  Assessment & Plan Polycythemia Check EPO, CBC, LDH, ferritin, JAK2 V617F and reflex. Agree with follow up for sleep apnea evaluation as planned. Follow up if primary polycythemia.  All questions answered.  CHIEF COMPLAINTS/PURPOSE OF CONSULTATION:  Erythrocytosis  HISTORY OF PRESENTING ILLNESS:  Pamela Marsh 48 y.o. female is here because of elevated hemoglobin.  Her records show borderline elevated hemoglobin level.  MCV was normal.  02/08/24 show hemoglobin 16.3 MCV 96.  WBC 10.6.  Platelet 263.  Creatinine 0.67.  Calcium 10.3 albumin 4.7 globulin 2.9.  Report history of headache but not daily and not frequent. No worsening headache. No erythromelalgia, gout, thrombosis, early satiety, itching after hot shower or bath.  Report recent colonoscopy and EGD were fine. No celiac disease, IBD, ulcer. Report some food, metal allergy.  She never suffer from diagnosis of blood clot. She thinks her dad may have died from VTE.  There is no prior diagnosis of obstructive sleep apnea. She has been referred to Pulmonary for assessment.   The patient denies loss of appetite or weight loss or skin itching.  The patient is a not a smoker.  No Congenital or chronic heart  condition No use of an anabolic steroids No usse of diuretics lead to reduced plasma volume No hematuria No history of heavy alcohol use or cirrhosis   No night sweats, unexpected weight loss, early satiety.  MEDICAL HISTORY:  Past Medical History:  Diagnosis Date   Chronic headaches    Compression fracture 10/06/2002   2 lumbar fractures. Healed with brace   Depression    hx of - years ago   Gestational diabetes    on glyburide -dly   Hyperlipidemia    Infection    UTI   Mental disorder    hx - no meds   Obesity    Osteoarthritis    Postpartum anxiety    Pregnancy induced hypertension    on meds around 33-34wks    SURGICAL HISTORY: Past Surgical History:  Procedure Laterality Date   DILATATION & CURRETTAGE/HYSTEROSCOPY WITH RESECTOCOPE N/A 03/30/2015   Procedure: DILATATION & CURETTAGE/ HYSTEROSCOPY WITH MYOSURE;  Surgeon: Burnard Bowers, MD;  Location: [facility];  Service: Gynecology;  Laterality: N/A;  1 hr. would like myosure available.   DILATION AND CURETTAGE OF UTERUS     DILATION AND EVACUATION  07/27/2012   Procedure: DILATATION AND EVACUATION;  Surgeon: Burnard LABOR. Bowers, MD;  Location: [facility];  Service: Gynecology;  Laterality: N/A;  with ultrasound   OPERATIVE ULTRASOUND N/A 03/30/2015   Procedure:  OPERATIVE ULTRASOUND. ;  Surgeon: Burnard Bowers, MD;  Location: [facility];  Service: Gynecology;  Laterality: N/A;   right knee surgery     acl x 2    SOCIAL HISTORY: Social History   Socioeconomic History   Marital status: Married    Spouse name: Not  on file   Number of children: 1   Years of education: Not on file   Highest education level: Not on file  Occupational History   Not on file  Tobacco Use   Smoking status: Former    Current packs/day: 0.00    Average packs/day: 0.5 packs/day for 4.0 years (2.0 ttl pk-yrs)    Types: Cigarettes    Start date: 01/26/1996    Quit date: 01/26/2000    Years since quitting: 24.1   Smokeless tobacco: Never  Vaping  Use   Vaping status: Never Used  Substance and Sexual Activity   Alcohol use: Yes    Alcohol/week: 0.0 - 4.0 standard drinks of alcohol    Comment: socially   Drug use: No   Sexual activity: Yes    Birth control/protection: None    Comment: husband had a vasectomy  Other Topics Concern   Not on file  Social History Narrative   Lives at home with husband and 7 y.o. daughter   Right handed   Caffeine: 8 cups/day   Social Drivers of Corporate investment banker Strain: Not on file  Food Insecurity: No Food Insecurity (03/31/2024)   Hunger Vital Sign    Worried About Running Out of Food in the Last Year: Never true    Ran Out of Food in the Last Year: Never true  Transportation Needs: No Transportation Needs (03/31/2024)   PRAPARE - Administrator, Civil Service (Medical): No    Lack of Transportation (Non-Medical): No  Physical Activity: Not on file  Stress: Not on file  Social Connections: Not on file  Intimate Partner Violence: Not At Risk (03/31/2024)   Humiliation, Afraid, Rape, and Kick questionnaire    Fear of Current or Ex-Partner: No    Emotionally Abused: No    Physically Abused: No    Sexually Abused: No    FAMILY HISTORY: Family History  Problem Relation Age of Onset   Hypertension Mother    Alzheimer's disease Mother    Arthritis Mother    Hypertension Father    Hyperlipidemia Father    Diabetes Father    Obesity Father    Sleep apnea Father    Snoring Father    Mental retardation Sister    Thyroid  disease Sister    Diabetes Sister    Hyperlipidemia Sister    Thyroid  disease Sister    Alcoholism Sister    Diabetes Maternal Grandmother    Diabetes Paternal Grandmother    Cancer Maternal Aunt    Lung cancer Maternal Aunt    Cancer Other    Colon cancer Neg Hx    Esophageal cancer Neg Hx    Pancreatic cancer Neg Hx    Colon polyps Neg Hx     ALLERGIES:  is allergic to hydrocodone.  MEDICATIONS:  Current Outpatient Medications   Medication Sig Dispense Refill   cetirizine (ZYRTEC ALLERGY) 10 MG tablet Take 10 mg by mouth as needed for allergies.     MELATONIN PO Take by mouth.     Probiotic Product (PROBIOTIC PO) Take by mouth.     Semaglutide-Weight Management (WEGOVY) 2.4 MG/0.75ML SOAJ Inject 2.4 mg into the skin once a week.     thyroid  (NP THYROID ) 30 MG tablet Take 30 mg by mouth daily before breakfast.     dicyclomine  (BENTYL ) 20 MG tablet Take 1 tablet (20 mg total) by mouth every 8 (eight) hours as needed for spasms. (Patient not taking: Reported on 03/31/2024)  120 tablet 3   pantoprazole  (PROTONIX ) 40 MG tablet Take 1 tablet (40 mg total) by mouth daily. (Patient not taking: Reported on 03/31/2024) 30 tablet 6   No current facility-administered medications for this visit.    REVIEW OF SYSTEMS:   All relevant systems were reviewed with the patient and are negative.  PHYSICAL EXAMINATION: ECOG PERFORMANCE STATUS: 0 - Asymptomatic  Vitals:   03/31/24 1448  BP: 122/79  Pulse: 93  Resp: 20  Temp: 97.7 F (36.5 C)  SpO2: 99%   Filed Weights   03/31/24 1448  Weight: 203 lb 12.8 oz (92.4 kg)    GENERAL: alert, no distress and comfortable SKIN: skin color normal and no erythema on exposed skin EYES: normal, sclera clear OROPHARYNX: no exudate  NECK: No palpable mass LYMPH:  no palpable cervical lymphadenopathy  LUNGS: clear to auscultation and percussion with normal breathing effort HEART: regular rate & rhythm and no murmurs  ABDOMEN: abdomen soft, non-tender and nondistended. Musculoskeletal: no edema. No erythromelalgia   LABORATORY DATA:  I have reviewed the data as listed Recent Results (from the past 2160 hours)  CBC with Differential (Cancer Center Only)     Status: Abnormal   Collection Time: 03/31/24  3:46 PM  Result Value Ref Range   WBC Count 9.5 4.0 - 10.5 K/uL   RBC 4.90 3.87 - 5.11 MIL/uL   Hemoglobin 15.6 (H) 12.0 - 15.0 g/dL   HCT 55.7 63.9 - 53.9 %   MCV 90.2 80.0 -  100.0 fL   MCH 31.8 26.0 - 34.0 pg   MCHC 35.3 30.0 - 36.0 g/dL   RDW 87.8 88.4 - 84.4 %   Platelet Count 308 150 - 400 K/uL   nRBC 0.0 0.0 - 0.2 %   Neutrophils Relative % 60 %   Neutro Abs 5.7 1.7 - 7.7 K/uL   Lymphocytes Relative 30 %   Lymphs Abs 2.9 0.7 - 4.0 K/uL   Monocytes Relative 7 %   Monocytes Absolute 0.7 0.1 - 1.0 K/uL   Eosinophils Relative 2 %   Eosinophils Absolute 0.2 0.0 - 0.5 K/uL   Basophils Relative 1 %   Basophils Absolute 0.1 0.0 - 0.1 K/uL   Immature Granulocytes 0 %   Abs Immature Granulocytes 0.02 0.00 - 0.07 K/uL    Comment: Performed at [facility], [address], [place], [place] [address]    RADIOGRAPHIC STUDIES: I have personally reviewed the radiological images related to her visit.   Pauletta JAYSON Chihuahua, MD 6/26/20254:18 PM

## 2024-03-31 ENCOUNTER — Inpatient Hospital Stay: Payer: Self-pay

## 2024-03-31 VITALS — BP 122/79 | HR 93 | Temp 97.7°F | Resp 20 | Wt 203.8 lb

## 2024-03-31 DIAGNOSIS — D751 Secondary polycythemia: Secondary | ICD-10-CM

## 2024-03-31 DIAGNOSIS — Z79899 Other long term (current) drug therapy: Secondary | ICD-10-CM | POA: Insufficient documentation

## 2024-03-31 DIAGNOSIS — E039 Hypothyroidism, unspecified: Secondary | ICD-10-CM | POA: Insufficient documentation

## 2024-03-31 LAB — CBC WITH DIFFERENTIAL (CANCER CENTER ONLY)
Abs Immature Granulocytes: 0.02 10*3/uL (ref 0.00–0.07)
Basophils Absolute: 0.1 10*3/uL (ref 0.0–0.1)
Basophils Relative: 1 %
Eosinophils Absolute: 0.2 10*3/uL (ref 0.0–0.5)
Eosinophils Relative: 2 %
HCT: 44.2 % (ref 36.0–46.0)
Hemoglobin: 15.6 g/dL — ABNORMAL HIGH (ref 12.0–15.0)
Immature Granulocytes: 0 %
Lymphocytes Relative: 30 %
Lymphs Abs: 2.9 10*3/uL (ref 0.7–4.0)
MCH: 31.8 pg (ref 26.0–34.0)
MCHC: 35.3 g/dL (ref 30.0–36.0)
MCV: 90.2 fL (ref 80.0–100.0)
Monocytes Absolute: 0.7 10*3/uL (ref 0.1–1.0)
Monocytes Relative: 7 %
Neutro Abs: 5.7 10*3/uL (ref 1.7–7.7)
Neutrophils Relative %: 60 %
Platelet Count: 308 10*3/uL (ref 150–400)
RBC: 4.9 MIL/uL (ref 3.87–5.11)
RDW: 12.1 % (ref 11.5–15.5)
WBC Count: 9.5 10*3/uL (ref 4.0–10.5)
nRBC: 0 % (ref 0.0–0.2)

## 2024-03-31 LAB — LACTATE DEHYDROGENASE: LDH: 172 U/L (ref 98–192)

## 2024-04-01 LAB — FERRITIN: Ferritin: 79 ng/mL (ref 11–307)

## 2024-04-01 LAB — ERYTHROPOIETIN: Erythropoietin: 7.6 m[IU]/mL (ref 2.6–18.5)

## 2024-04-10 LAB — NGS JAK2 EXONS 12-15

## 2024-04-11 ENCOUNTER — Ambulatory Visit: Payer: Self-pay

## 2024-05-11 ENCOUNTER — Encounter (HOSPITAL_BASED_OUTPATIENT_CLINIC_OR_DEPARTMENT_OTHER): Payer: Self-pay | Admitting: Nurse Practitioner

## 2024-05-11 ENCOUNTER — Ambulatory Visit (HOSPITAL_BASED_OUTPATIENT_CLINIC_OR_DEPARTMENT_OTHER): Admitting: Nurse Practitioner

## 2024-05-11 VITALS — BP 110/72 | HR 103 | Ht 68.0 in | Wt 204.4 lb

## 2024-05-11 DIAGNOSIS — Z87891 Personal history of nicotine dependence: Secondary | ICD-10-CM | POA: Diagnosis not present

## 2024-05-11 DIAGNOSIS — R0683 Snoring: Secondary | ICD-10-CM | POA: Diagnosis not present

## 2024-05-11 DIAGNOSIS — E66811 Obesity, class 1: Secondary | ICD-10-CM | POA: Insufficient documentation

## 2024-05-11 DIAGNOSIS — G4719 Other hypersomnia: Secondary | ICD-10-CM | POA: Diagnosis not present

## 2024-05-11 DIAGNOSIS — Z6831 Body mass index (BMI) 31.0-31.9, adult: Secondary | ICD-10-CM

## 2024-05-11 NOTE — Progress Notes (Signed)
 [place] Sleepiness Scale  Use the following scale to choose the most appropriate number for each situation. 0 Would never nod off 1  Slight  chance of nodding off 2 Moderate chance of nodding off 3 High chance of nodding off  Sitting and reading: 2 Watching TV: 2 Sitting, inactive, in a public place (e.g., in a meeting, theater, or dinner event): 0 As a passenger in a car for an hour or more without stopping for a break: 1 Lying down to rest when circumstances permit:1 Sitting and talking to someone: 0 Sitting quietly after a meal without alcohol: 0 In a car, while stopped for a few  minutes in traffic or at a light: 0  TOTOAL: 6

## 2024-05-11 NOTE — Assessment & Plan Note (Signed)
 She has snoring, excessive daytime sleepiness, restless sleep. BMI 31. Multiple non-diagnostic studies per her report aside from very mild OSA on in lab between 2012-2015; records unavailable. Last sleep study 05/2021 negative for OSA with BMI 30. Symptoms have been ongoing since early adulthood. She has a hx of weakness/syncope with extreme pain but no hx with strong emotions. Unclear if she has undiagnosed OSA, other sleep disorder, or possibly narcolepsy. Recommend she have in lab split night sleep study followed by MSLT, if negative for OSA. Will hold Vyvanse 2 weeks prior for wash out.    - discussed how weight can impact sleep and risk for sleep disordered breathing - discussed options to assist with weight loss: combination of diet modification, cardiovascular and strength training exercises   - had an extensive discussion regarding the adverse health consequences related to untreated sleep disordered breathing - specifically discussed the risks for hypertension, coronary artery disease, cardiac dysrhythmias, cerebrovascular disease, and diabetes - lifestyle modification discussed   - discussed how sleep disruption can increase risk of accidents, particularly when driving - safe driving practices were discussed   Patient Instructions  Given your symptoms, I am concerned that you may have sleep disordered breathing with sleep apnea. With your prior history and unremarkable workup thus far, I do think it's worth while to rule out narcolepsy as well. You will need an in lab overnight sleep study and a daytime nap study for further evaluation. Someone will contact you to schedule this.   If the overnight study shows sleep apnea, they will cancel the nap study Hold your Vyvanse for 2 weeks prior to the sleep studies   We discussed how untreated sleep apnea puts an individual at risk for cardiac arrhthymias, pulm HTN, DM, stroke and increases their risk for daytime accidents. We also briefly  reviewed treatment options including weight loss, side sleeping position, oral appliance, CPAP therapy or referral to ENT for possible surgical options  Use caution when driving and pull over if you become sleepy.  Follow up in 10-12 weeks with Katie Eian Vandervelden,NP to go over sleep study results, or sooner, if needed. Friday PM virtual clinic preferred

## 2024-05-11 NOTE — Progress Notes (Signed)
 @Patient  ID: Pamela Marsh, female    DOB: 04-18-1976, 48 y.o.   MRN: [id number]  Chief Complaint  Patient presents with   Establish Care    Sleep consult    Referring provider: Rosalea Rosina SAILOR, PA  HPI: 48 year old female, former smoker referred for sleep consult. Past medical history significant for preeclampsia, ADD, allergies,   TEST/EVENTS:  06/05/2021 HST: Negative for OSA  05/11/2024: Today - sleep consult Discussed the use of AI scribe software for clinical note transcription with the patient, who gave verbal consent to proceed.  History of Present Illness Pamela Marsh is a 48 year old female who presents with persistent sleep disturbances. She is accompanied by her young daughter.  She has experienced sleep disturbances characterized by frequent awakenings throughout the night since early adulthood. She describes herself as a 'very light sleeper' and can fall asleep within 30 minutes but struggles to maintain sleep, leading to tiredness. Previous evaluations include a home sleep study in 2022, which did not show significant sleep apnea, and an in-lab study between 2012 and 2015 that indicated very mild sleep apnea but results are not available. She has not been on any therapy for sleep apnea.  She has tried sleep medications in the past, including Lunesta and Ambien . Lunesta caused a 'really bad metallic taste' and Ambien  did not prevent her from waking up during the night. Currently, she takes melatonin and magnesium at night, which help her fall asleep but do not maintain sleep. No symptoms of restless leg syndrome, vivid nightmares, sleepwalking, or sleep paralysis. She limits caffeine consumption, avoiding it after 10 or 11 AM.  She is currently on Vyvanse 20 mg daily for ADD, which she started recently. a past echocardiogram showed one chamber of her heart being larger than normal. Waiting further cardiology evaluation. In her early twenties, she experienced  episodes of falling asleep at stoplights, but this has not been a recent issue. She still can feel like she's going to doze easily if she stops and is sedentary. She has passed out in the past, typically related to pain after surgeries. No episodes related to extreme emotions.  She reports snoring, which her daughter describes as sounding like a bear. Her weight has fluctuated over the past four years, with a noted increase of 15-20 pounds over the last two years. She has experienced gastrointestinal issues, which she attributes to food sensitivities identified in the past four years.  She goes to bed between 9 to 10 PM.  Falls asleep within 30 minutes.  Wakes 2-3 times a day.  Gets up between 430 and 5:30 AM.  Does not operate any heavy machinery in her job Animal nutritionist.  Lives with her husband and daughter.  Used to be a Designer, television/film set; now stays at home with her children and actively volunteers.  [place] [address]   Allergies  Allergen Reactions   Hydrocodone Nausea And Vomiting    There is no immunization history for the selected administration types on file for this patient.  Past Medical History:  Diagnosis Date   Chronic headaches    Compression fracture 10/06/2002   2 lumbar fractures. Healed with brace   Depression    hx of - years ago   Gestational diabetes    on glyburide -dly   Hyperlipidemia    Infection    UTI   Mental disorder    hx - no meds   Obesity    Osteoarthritis    Postpartum anxiety  Pregnancy induced hypertension    on meds around 33-34wks    Tobacco History: Social History   Tobacco Use  Smoking Status Former   Current packs/day: 0.00   Average packs/day: 0.5 packs/day for 4.0 years (2.0 ttl pk-yrs)   Types: Cigarettes   Start date: 01/26/1996   Quit date: 01/26/2000   Years since quitting: 24.3  Smokeless Tobacco Never   Counseling given: Not Answered   Outpatient Medications Prior to Visit  Medication Sig Dispense Refill   cetirizine (ZYRTEC  ALLERGY) 10 MG tablet Take 10 mg by mouth as needed for allergies.     lisdexamfetamine (VYVANSE) 20 MG capsule Take 20 mg by mouth daily.     MELATONIN PO Take by mouth.     Probiotic Product (PROBIOTIC PO) Take by mouth.     Semaglutide-Weight Management (WEGOVY) 2.4 MG/0.75ML SOAJ Inject 2.4 mg into the skin once a week.     thyroid  (NP THYROID ) 30 MG tablet Take 30 mg by mouth daily before breakfast.     dicyclomine  (BENTYL ) 20 MG tablet Take 1 tablet (20 mg total) by mouth every 8 (eight) hours as needed for spasms. (Patient not taking: Reported on 03/31/2024) 120 tablet 3   pantoprazole  (PROTONIX ) 40 MG tablet Take 1 tablet (40 mg total) by mouth daily. (Patient not taking: Reported on 03/31/2024) 30 tablet 6   No facility-administered medications prior to visit.     Review of Systems: As above; otherwise, negative     Physical Exam:  BP 110/72   Pulse (!) 103   Ht 5' 8 (1.727 m)   Wt 204 lb 6.4 oz (92.7 kg)   SpO2 98%   BMI 31.08 kg/m   GEN: Pleasant, interactive, well-appearing; obese; in no acute distress HEENT:  Normocephalic and atraumatic. PERRLA. Sclera white. Nasal turbinates pink, moist and patent bilaterally. No rhinorrhea present. Oropharynx pink and moist, without exudate or edema. No lesions, ulcerations, or postnasal drip. Mallampati II NECK:  Supple w/ fair ROM. Thyroid  symmetrical with no goiter or nodules palpated. No lymphadenopathy.   CV: RRR, no m/r/g, no peripheral edema. Pulses intact, +2 bilaterally. No cyanosis, pallor or clubbing. PULMONARY:  Unlabored, regular breathing. Clear bilaterally A&P w/o wheezes/rales/rhonchi. No accessory muscle use.  GI: BS present and normoactive. Soft, non-tender to palpation. No organomegaly or masses detected.  MSK: No erythema, warmth or tenderness. Cap refil <2 sec all extrem.  Neuro: A/Ox3. No focal deficits noted.   Skin: Warm, no lesions or rashe Psych: Normal affect and behavior. Judgement and thought content  appropriate.     Lab Results:  CBC    Component Value Date/Time   WBC 9.5 03/31/2024 1546   WBC 13.8 (H) 12/23/2021 1851   RBC 4.90 03/31/2024 1546   HGB 15.6 (H) 03/31/2024 1546   HCT 44.2 03/31/2024 1546   PLT 308 03/31/2024 1546   MCV 90.2 03/31/2024 1546   MCH 31.8 03/31/2024 1546   MCHC 35.3 03/31/2024 1546   RDW 12.1 03/31/2024 1546   LYMPHSABS 2.9 03/31/2024 1546   MONOABS 0.7 03/31/2024 1546   EOSABS 0.2 03/31/2024 1546   BASOSABS 0.1 03/31/2024 1546    BMET    Component Value Date/Time   NA 138 12/23/2021 1851   K 3.6 12/23/2021 1851   CL 104 12/23/2021 1851   CO2 23 12/23/2021 1851   GLUCOSE 88 12/23/2021 1851   BUN 17 12/23/2021 1851   CREATININE 0.74 12/23/2021 1851   CALCIUM 10.2 12/23/2021 1851   GFRNONAA >60  12/23/2021 1851   GFRAA >90 03/18/2014 0515    BNP No results found for: BNP   Imaging:  No results found.  Administration History     None           No data to display          No results found for: NITRICOXIDE      Assessment & Plan:   Excessive daytime sleepiness She has snoring, excessive daytime sleepiness, restless sleep. BMI 31. Multiple non-diagnostic studies per her report aside from very mild OSA on in lab between 2012-2015; records unavailable. Last sleep study 05/2021 negative for OSA with BMI 30. Symptoms have been ongoing since early adulthood. She has a hx of weakness/syncope with extreme pain but no hx with strong emotions. Unclear if she has undiagnosed OSA, other sleep disorder, or possibly narcolepsy. Recommend she have in lab split night sleep study followed by MSLT, if negative for OSA. Will hold Vyvanse 2 weeks prior for wash out.    - discussed how weight can impact sleep and risk for sleep disordered breathing - discussed options to assist with weight loss: combination of diet modification, cardiovascular and strength training exercises   - had an extensive discussion regarding the adverse health  consequences related to untreated sleep disordered breathing - specifically discussed the risks for hypertension, coronary artery disease, cardiac dysrhythmias, cerebrovascular disease, and diabetes - lifestyle modification discussed   - discussed how sleep disruption can increase risk of accidents, particularly when driving - safe driving practices were discussed   Patient Instructions  Given your symptoms, I am concerned that you may have sleep disordered breathing with sleep apnea. With your prior history and unremarkable workup thus far, I do think it's worth while to rule out narcolepsy as well. You will need an in lab overnight sleep study and a daytime nap study for further evaluation. Someone will contact you to schedule this.   If the overnight study shows sleep apnea, they will cancel the nap study Hold your Vyvanse for 2 weeks prior to the sleep studies   We discussed how untreated sleep apnea puts an individual at risk for cardiac arrhthymias, pulm HTN, DM, stroke and increases their risk for daytime accidents. We also briefly reviewed treatment options including weight loss, side sleeping position, oral appliance, CPAP therapy or referral to ENT for possible surgical options  Use caution when driving and pull over if you become sleepy.  Follow up in 10-12 weeks with Katie Neils [place],NP to go over sleep study results, or sooner, if needed. Friday PM virtual clinic preferred     Obesity (BMI 30.0-34.9) BMI 31. Healthy weight loss encouraged   Advised if symptoms do not improve or worsen, to please contact office for sooner follow up or seek emergency care.   I spent 45 minutes of dedicated to the care of this patient on the date of this encounter to include pre-visit review of records, face-to-face time with the patient discussing conditions above, post visit ordering of testing, clinical documentation with the electronic health record, making appropriate referrals as documented, and  communicating necessary findings to members of the patients care team.  [name] LULLA Rouleau, NP 05/11/2024  Pt aware and understands NP's role.

## 2024-05-11 NOTE — Assessment & Plan Note (Signed)
BMI 31. Healthy weight loss encouraged.  

## 2024-05-11 NOTE — Patient Instructions (Addendum)
 Given your symptoms, I am concerned that you may have sleep disordered breathing with sleep apnea. With your prior history and unremarkable workup thus far, I do think it's worth while to rule out narcolepsy as well. You will need an in lab overnight sleep study and a daytime nap study for further evaluation. Someone will contact you to schedule this.   If the overnight study shows sleep apnea, they will cancel the nap study Hold your Vyvanse for 2 weeks prior to the sleep studies   We discussed how untreated sleep apnea puts an individual at risk for cardiac arrhthymias, pulm HTN, DM, stroke and increases their risk for daytime accidents. We also briefly reviewed treatment options including weight loss, side sleeping position, oral appliance, CPAP therapy or referral to ENT for possible surgical options  Use caution when driving and pull over if you become sleepy.  Follow up in 10-12 weeks with Katie Ira Dougher,NP to go over sleep study results, or sooner, if needed. Friday PM virtual clinic preferred

## 2024-06-02 ENCOUNTER — Encounter (HOSPITAL_BASED_OUTPATIENT_CLINIC_OR_DEPARTMENT_OTHER): Payer: Self-pay | Admitting: Pulmonary Disease

## 2024-06-27 ENCOUNTER — Ambulatory Visit (HOSPITAL_BASED_OUTPATIENT_CLINIC_OR_DEPARTMENT_OTHER): Admitting: Pulmonary Disease

## 2024-06-28 ENCOUNTER — Encounter (HOSPITAL_BASED_OUTPATIENT_CLINIC_OR_DEPARTMENT_OTHER): Admitting: Pulmonary Disease

## 2024-07-17 ENCOUNTER — Encounter (HOSPITAL_BASED_OUTPATIENT_CLINIC_OR_DEPARTMENT_OTHER): Payer: Self-pay | Admitting: Pulmonary Disease

## 2024-08-08 ENCOUNTER — Encounter (HOSPITAL_BASED_OUTPATIENT_CLINIC_OR_DEPARTMENT_OTHER): Admitting: Pulmonary Disease

## 2024-08-09 ENCOUNTER — Encounter (HOSPITAL_BASED_OUTPATIENT_CLINIC_OR_DEPARTMENT_OTHER): Admitting: Pulmonary Disease

## 2024-08-09 ENCOUNTER — Ambulatory Visit (HOSPITAL_BASED_OUTPATIENT_CLINIC_OR_DEPARTMENT_OTHER): Attending: Nurse Practitioner | Admitting: Pulmonary Disease

## 2024-08-09 DIAGNOSIS — R0683 Snoring: Secondary | ICD-10-CM | POA: Insufficient documentation

## 2024-08-09 DIAGNOSIS — G4719 Other hypersomnia: Secondary | ICD-10-CM | POA: Insufficient documentation

## 2024-08-10 ENCOUNTER — Ambulatory Visit (HOSPITAL_BASED_OUTPATIENT_CLINIC_OR_DEPARTMENT_OTHER): Attending: Nurse Practitioner | Admitting: Pulmonary Disease

## 2024-08-10 DIAGNOSIS — G4710 Hypersomnia, unspecified: Secondary | ICD-10-CM | POA: Insufficient documentation

## 2024-08-10 DIAGNOSIS — G4719 Other hypersomnia: Secondary | ICD-10-CM | POA: Diagnosis not present

## 2024-08-10 DIAGNOSIS — G47419 Narcolepsy without cataplexy: Secondary | ICD-10-CM | POA: Diagnosis not present

## 2024-08-12 ENCOUNTER — Ambulatory Visit: Admitting: Nurse Practitioner

## 2024-08-19 ENCOUNTER — Ambulatory Visit: Payer: Self-pay | Admitting: Nurse Practitioner

## 2024-08-19 DIAGNOSIS — G4719 Other hypersomnia: Secondary | ICD-10-CM | POA: Diagnosis not present

## 2024-08-19 NOTE — Progress Notes (Signed)
 MSLT consistent with narcolepsy. No OSA on sleep study. Will discuss at her follow up. Thanks.

## 2024-08-19 NOTE — Procedures (Signed)
 [facility] [address] [place], [place] [address] Tel: [phone]   Fax: [phone]  Polysomnography Interpretation  Patient Name:  Pamela Marsh, FREET Study Date:  08/09/2024 Referring Physician:  KATHERINE COBB [phone]) %%startinterp%% Indications for Polysomnography The patient is a 48 year old Female who is 5' 5 and weighs 196.0 lbs. Her BMI equals 32.7.  A full night polysomnogram was performed to evaluate for hypersomnolence.  Polysomnogram Data A full night polysomnogram recorded the standard physiologic parameters including EEG, EOG, EMG, EKG, nasal and oral airflow.  Respiratory parameters of chest and abdominal movements were recorded with Respiratory Inductance Plethysmography belts.  Oxygen saturation was recorded by pulse oximetry.   Sleep Architecture The total recording time of the polysomnogram was 445.7 minutes.  The total sleep time was 377.5 minutes.  The patient spent 7.0% of total sleep time in Stage N1, 66.1% in Stage N2, 7.9% in Stages N3, and 18.9% in REM.  Sleep latency was 24.8 minutes.  REM latency was 59.0 minutes.  Sleep Efficiency was 84.7%.  Wake after Sleep Onset time was 43.5 minutes.  Respiratory Events The polysomnogram revealed a presence of - obstructive, 1 central, and - mixed apneas resulting in an Apnea index of 0.2 events per hour.  There were 14 hypopneas (>=3% desaturation and/or arousal) resulting in an Apnea\Hypopnea Index (AHI >=3% desaturation and/or arousal) of 2.4 events per hour.  There were 3 hypopneas (>=4% desaturation) resulting in an Apnea\Hypopnea Index (AHI >=4% desaturation) of 0.6 events per hour.  There were 24 Respiratory Effort Related Arousals resulting in a RERA index of 3.8 events per hour. The Respiratory Disturbance Index is 6.2 events per hour.  The snore index was - events per hour.  Mean oxygen saturation was 94.3%.  The lowest oxygen saturation during sleep was 91.0%.  Time spent  <=88% oxygen saturation was - minutes (-).  Limb Activity There were - total limb movements recorded, of this total, - were classified as PLMs.  PLM index was - per hour and PLM associated with Arousals index was - per hour.  Cardiac Summary The average pulse rate was 82.9 bpm.  The minimum pulse rate was 68.0 bpm while the maximum pulse rate was 110.0 bpm.  Cardiac rhythm was normal/abnormal.  Comments: 5 REM periods were noted. REM latency was 71 mins  Diagnosis: Hypersomnolence No evidence of sleep disordered breathing or other cause identified during this study   Recommendations: Proceed with MSLT - differential includes narcolepsy & idiopathic hypersomnolence Encourage regular exercise and weight loss efforts Caution against driving when sleepy & against medications with sedative side effects.    This study was personally reviewed and electronically signed by: Dr. Harden Staff Accredited Board Certified in Sleep Medicine Date/Time:

## 2024-08-19 NOTE — Procedures (Signed)
 [facility] [address] [place], [place] [address] Tel: [phone]   Fax: [phone]  Multiple Sleep Latency Test (MSLT) Interpretation  Patient Name:  ANAMARI, GALEAS Study Date:  08/10/2024 Referring Physician:  KATHERINE COBB [phone]) %%startinterp%% Indications for study: The patient is a 48 year old Female who is 5' 5 and weighs 196.0 lbs. Her BMI equals 32.7. A multiple sleep latency test was performed following a full night polysomnography.  Medication  No Data.   MSLT Summary: Number of naps 5 of 5. Number of naps with sleep was 5. Mean Sleep Latency was 3.3 minutes. REM Sleep Onset was present during 2 naps.  Comments: Preceding NPSG showed TST 377 mins with 5 REM periods, REM latency 71 mins. She was asked to suspend the prescribed Vyvanse two week prior to testing. No sleep diary was available  Diagnosis: Hypersomnolence confirmed by low mean sleep latency . MSLT meets SOREM criteria for narcolepsy  Recommendations: Consider treatment with medications for narcolepsy especially if residual somnolence is still present on Vyvanse Emphasize importance of nap schedule Encourage regular exercise and weight loss efforts Caution against driving when sleepy & against medications with sedative side effects.     This study was personally reviewed and electronically signed by: Dr. Harden Staff Accredited Board Certified in Sleep Medicine

## 2024-08-23 NOTE — Telephone Encounter (Signed)
 Copied from CRM [id number]. Topic: Clinical - Lab/Test Results >> Aug 23, 2024  3:04 PM Rozanna MATSU wrote: Reason for CRM: pt returning call to [facility] per note maybe about sleep results  Called and spoke to patient on results.relayed results,patient verbalized understanding.NFN

## 2024-08-25 ENCOUNTER — Telehealth (INDEPENDENT_AMBULATORY_CARE_PROVIDER_SITE_OTHER): Admitting: Nurse Practitioner

## 2024-08-25 ENCOUNTER — Encounter: Payer: Self-pay | Admitting: Nurse Practitioner

## 2024-08-25 DIAGNOSIS — D751 Secondary polycythemia: Secondary | ICD-10-CM

## 2024-08-25 DIAGNOSIS — G47419 Narcolepsy without cataplexy: Secondary | ICD-10-CM | POA: Diagnosis not present

## 2024-08-25 NOTE — Patient Instructions (Addendum)
 Your sleep study was negative for sleep apnea.  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 Recommend scheduling naps, as necessary for daytime sleepiness. You can continue the Vyvanse, as prescribed for ADD, to help combat the daytime drowsiness as well   We reviewed potential treatment options for nighttime sleep  Recommended trial of Xywav to help with nighttime sleep disruptions caused by narcolepsy. Do not take this with other sedating medications. You will take the first dose after you get in the bed, with plans to fall asleep within 5-15 minutes. You will set a timer and take the second dose within 2.5-4 hours (depending on when your wake time the next morning needs to be as you'll want another 6 hours in the bed).  Ensure you have 7-8 hours in the bed after taking. Monitor for any mood changes or changes in sleep habits. Stop and notify immediately if these occur. Do not drive or operate heavy machinery after taking. Do not take with alcohol or other sedating medications. May cause some morning grogginess or vivid dreams.   Complete the patient enrollment form I sent you on MyChart Call us  if you haven't heard anything about getting started on this in the next two weeks. It is a specialty medication so we have to complete paperwork and wait on insurance.   Follow up in January with Dr. Neda. If symptoms do not improve or worsen, please contact office for sooner follow up or seek emergency care.

## 2024-08-25 NOTE — Assessment & Plan Note (Signed)
 No OSA. Advised to f/u with hematology as scheduled

## 2024-08-25 NOTE — Assessment & Plan Note (Addendum)
 Recent MSLT with 2/5 SOREMs, diagnostic for narcolepsy. Improved daytime symptoms with stimulant regimen. Persistent night time sleep disruptions. Reviewed pharmacological therapy options. Will trial her on Xywav. Side effect/safety profile reviewed. Enrollment paperwork completed. Sleep hygiene reviewed. Safe driving practices reviewed.   Patient Instructions  Your sleep study was negative for sleep apnea.  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 Recommend scheduling naps, as necessary for daytime sleepiness. You can continue the Vyvanse, as prescribed for ADD, to help combat the daytime drowsiness as well   We reviewed potential treatment options for nighttime sleep  Recommended trial of Xywav to help with nighttime sleep disruptions caused by narcolepsy. Do not take this with other sedating medications. You will take the first dose after you get in the bed, with plans to fall asleep within 5-15 minutes. You will set a timer and take the second dose within 2.5-4 hours (depending on when your wake time the next morning needs to be as you'll want another 6 hours in the bed).  Ensure you have 7-8 hours in the bed after taking. Monitor for any mood changes or changes in sleep habits. Stop and notify immediately if these occur. Do not drive or operate heavy machinery after taking. Do not take with alcohol or other sedating medications. May cause some morning grogginess or vivid dreams.   Complete the patient enrollment form I sent you on MyChart Call us  if you haven't heard anything about getting started on this in the next two weeks. It is a specialty medication so we have to complete paperwork and wait on insurance.   Follow up in January with Dr. Neda. If symptoms do not improve or worsen, please contact office for sooner follow up or seek emergency care.

## 2024-08-25 NOTE — Progress Notes (Signed)
 @Patient  ID: Pamela Marsh, female    DOB: 05/16/1976, 48 y.o.   MRN: [id number]  Chief Complaint  Patient presents with   Medical Management of Chronic Issues   EXCESSIVE DAYTIME SLEEPINESS    Review HST    Referring provider: Clarice Nottingham, MD  Virtual Visit via Video Note  I connected with Pamela Marsh on 08/25/24 at  1:00 PM EST by a video enabled telemedicine application and verified that I am speaking with the correct person using two identifiers.  Location: Patient: Home Provider: Office   I discussed the limitations of evaluation and management by telemedicine and the availability of in person appointments. The patient expressed understanding and agreed to proceed.  History of Present Illness: 48 year old female, former smoker followed for narcolepsy. Past medical history significant for preeclampsia, ADD, allergies, obesity, polycythemia.   TEST/EVENTS:  06/05/2021 HST: Negative for OSA 08/09/2024 PSG negative for OSA 08/09/2024 MSLT: 2/5 SOREMs >> narcolepsy   05/11/2024: OV with Baby Stairs NP for sleep consult History of Present Illness Pamela Marsh is a 48 year old female who presents with persistent sleep disturbances. She is accompanied by her young daughter. She has experienced sleep disturbances characterized by frequent awakenings throughout the night since early adulthood. She describes herself as a 'very light sleeper' and can fall asleep within 30 minutes but struggles to maintain sleep, leading to tiredness. Previous evaluations include a home sleep study in 2022, which did not show significant sleep apnea, and an in-lab study between 2012 and 2015 that indicated very mild sleep apnea but results are not available. She has not been on any therapy for sleep apnea. She has tried sleep medications in the past, including Lunesta and Ambien . Lunesta caused a 'really bad metallic taste' and Ambien  did not prevent her from waking up during the night. Currently,  she takes melatonin and magnesium at night, which help her fall asleep but do not maintain sleep. No symptoms of restless leg syndrome, vivid nightmares, sleepwalking, or sleep paralysis. She limits caffeine consumption, avoiding it after 10 or 11 AM. She is currently on Vyvanse 20 mg daily for ADD, which she started recently. a past echocardiogram showed one chamber of her heart being larger than normal. Waiting further cardiology evaluation. In her early twenties, she experienced episodes of falling asleep at stoplights, but this has not been a recent issue. She still can feel like she's going to doze easily if she stops and is sedentary. She has passed out in the past, typically related to pain after surgeries. No episodes related to extreme emotions. She reports snoring, which her daughter describes as sounding like a bear. Her weight has fluctuated over the past four years, with a noted increase of 15-20 pounds over the last two years. She has experienced gastrointestinal issues, which she attributes to food sensitivities identified in the past four years. She goes to bed between 9 to 10 PM.  Falls asleep within 30 minutes.  Wakes 2-3 times a day.  Gets up between 430 and 5:30 AM.  Does not operate any heavy machinery in her job animal nutritionist.  Lives with her husband and daughter.  Used to be a designer, television/film set; now stays at home with her children and actively volunteers. [place] [address]   08/25/2024: Today - follow up Patient presents today for follow up to discuss recent sleep study results. Her overnight study was negative for sleep apnea. She did have a MSLT with findings consistent with narcolepsy. She feels unchanged  compared to her last visit. She has been using the Vyvanse for a few months and it seems to be helping with her daytime sleepiness. She feels she is doing well on this. She continues to have difficulties with her nighttime sleep though. She struggles with frequent night awakenings. She has tried  ambien  and lunesta in the past but they didn't keep her asleep. Her mood has been stable. She denies any drowsy driving or sleep parasomnias.  She has polycythemia, which is what prompted her initial evaluation for OSA. She sees hematology for this.   Allergies  Allergen Reactions   Hydrocodone Nausea And Vomiting    There is no immunization history for the selected administration types on file for this patient.  Past Medical History:  Diagnosis Date   Chronic headaches    Compression fracture 10/06/2002   2 lumbar fractures. Healed with brace   Depression    hx of - years ago   Gestational diabetes    on glyburide -dly   Hyperlipidemia    Infection    UTI   Mental disorder    hx - no meds   Obesity    Osteoarthritis    Postpartum anxiety    Pregnancy induced hypertension    on meds around 33-34wks    Tobacco History: Social History   Tobacco Use  Smoking Status Former   Current packs/day: 0.00   Average packs/day: 0.5 packs/day for 4.0 years (2.0 ttl pk-yrs)   Types: Cigarettes   Start date: 01/26/1996   Quit date: 01/26/2000   Years since quitting: 24.5  Smokeless Tobacco Never   Counseling given: Not Answered   Outpatient Medications Prior to Visit  Medication Sig Dispense Refill   lisdexamfetamine (VYVANSE) 20 MG capsule Take 20 mg by mouth daily.     MELATONIN PO Take by mouth.     Probiotic Product (PROBIOTIC PO) Take by mouth.     Semaglutide-Weight Management (WEGOVY) 2.4 MG/0.75ML SOAJ Inject 2.4 mg into the skin once a week.     thyroid  (NP THYROID ) 30 MG tablet Take 30 mg by mouth daily before breakfast.     cetirizine (ZYRTEC ALLERGY) 10 MG tablet Take 10 mg by mouth as needed for allergies.     No facility-administered medications prior to visit.     Review of Systems: As above; otherwise, negative     Physical Exam:  Patient is well-developed, well-nourished in no acute distress.  Resting comfortably at home.  No labored breathing.   Speech is clear and coherent with logical content.  Patient is alert and oriented at baseline.     Lab Results:  CBC    Component Value Date/Time   WBC 9.5 03/31/2024 1546   WBC 13.8 (H) 12/23/2021 1851   RBC 4.90 03/31/2024 1546   HGB 15.6 (H) 03/31/2024 1546   HCT 44.2 03/31/2024 1546   PLT 308 03/31/2024 1546   MCV 90.2 03/31/2024 1546   MCH 31.8 03/31/2024 1546   MCHC 35.3 03/31/2024 1546   RDW 12.1 03/31/2024 1546   LYMPHSABS 2.9 03/31/2024 1546   MONOABS 0.7 03/31/2024 1546   EOSABS 0.2 03/31/2024 1546   BASOSABS 0.1 03/31/2024 1546    BMET    Component Value Date/Time   NA 138 12/23/2021 1851   K 3.6 12/23/2021 1851   CL 104 12/23/2021 1851   CO2 23 12/23/2021 1851   GLUCOSE 88 12/23/2021 1851   BUN 17 12/23/2021 1851   CREATININE 0.74 12/23/2021 1851   CALCIUM 10.2  12/23/2021 1851   GFRNONAA >60 12/23/2021 1851   GFRAA >90 03/18/2014 0515    BNP No results found for: BNP   Imaging:  Sleep Study Documents Result Date: 08/19/2024 Ordered by an unspecified provider.  Multiple sleep latency test Result Date: 08/10/2024 Jude Harden GAILS, MD     08/19/2024 12:47 PM Darryle Law [facility] [address] [place], [place] [address] Tel: [phone]   Fax: [phone] Multiple Sleep Latency Test (MSLT) Interpretation Patient Name:  IDABELLE, MCPETERS Study Date:  08/10/2024 Referring Physician:  Ashima Shrake [phone]) %%startinterp%% Indications for study: The patient is a 48 year old Female who is 5' 5 and weighs 196.0 lbs. Her BMI equals 32.7. A multiple sleep latency test was performed following a full night polysomnography. Medication No Data. MSLT Summary: Number of naps 5 of 5. Number of naps with sleep was 5. Mean Sleep Latency was 3.3 minutes. REM Sleep Onset was present during 2 naps. Comments: Preceding NPSG showed TST 377 mins with 5 REM periods, REM latency 71 mins. She was asked to suspend the prescribed Vyvanse two week  prior to testing. No sleep diary was available Diagnosis: Hypersomnolence confirmed by low mean sleep latency . MSLT meets SOREM criteria for narcolepsy Recommendations: Consider treatment with medications for narcolepsy especially if residual somnolence is still present on Vyvanse Emphasize importance of nap schedule Encourage regular exercise and weight loss efforts Caution against driving when sleepy & against medications with sedative side effects. This study was personally reviewed and electronically signed by: Dr. Harden Jude Accredited Board Certified in Sleep Medicine  Split night study Result Date: 08/09/2024 Jude Harden GAILS, MD     08/19/2024 12:40 PM Darryle Law [facility] [address] [place], [place] [address] Tel: [phone]   Fax: [phone] Polysomnography Interpretation Patient Name:  DAREN, DOSWELL Study Date:  08/09/2024 Referring Physician:  Noreen Mackintosh [phone]) %%startinterp%% Indications for Polysomnography The patient is a 48 year old Female who is 5' 5 and weighs 196.0 lbs. Her BMI equals 32.7.  A full night polysomnogram was performed to evaluate for hypersomnolence. Polysomnogram Data A full night polysomnogram recorded the standard physiologic parameters including EEG, EOG, EMG, EKG, nasal and oral airflow.  Respiratory parameters of chest and abdominal movements were recorded with Respiratory Inductance Plethysmography belts.  Oxygen saturation was recorded by pulse oximetry. Sleep Architecture The total recording time of the polysomnogram was 445.7 minutes.  The total sleep time was 377.5 minutes.  The patient spent 7.0% of total sleep time in Stage N1, 66.1% in Stage N2, 7.9% in Stages N3, and 18.9% in REM.  Sleep latency was 24.8 minutes.  REM latency was 59.0 minutes.  Sleep Efficiency was 84.7%.  Wake after Sleep Onset time was 43.5 minutes. Respiratory Events The polysomnogram revealed a presence of - obstructive, 1 central, and - mixed apneas  resulting in an Apnea index of 0.2 events per hour.  There were 14 hypopneas (>=3% desaturation and/or arousal) resulting in an Apnea\Hypopnea Index (AHI >=3% desaturation and/or arousal) of 2.4 events per hour.  There were 3 hypopneas (>=4% desaturation) resulting in an Apnea\Hypopnea Index (AHI >=4% desaturation) of 0.6 events per hour.  There were 24 Respiratory Effort Related Arousals resulting in a RERA index of 3.8 events per hour. The Respiratory Disturbance Index is 6.2 events per hour.  The snore index was - events per hour. Mean oxygen saturation was 94.3%.  The lowest oxygen saturation during sleep was 91.0%.  Time spent <=88% oxygen saturation was -  minutes (-). Limb Activity There were - total limb movements recorded, of this total, - were classified as PLMs.  PLM index was - per hour and PLM associated with Arousals index was - per hour. Cardiac Summary The average pulse rate was 82.9 bpm.  The minimum pulse rate was 68.0 bpm while the maximum pulse rate was 110.0 bpm.  Cardiac rhythm was normal/abnormal. Comments: 5 REM periods were noted. REM latency was 71 mins Diagnosis: Hypersomnolence No evidence of sleep disordered breathing or other cause identified during this study Recommendations: Proceed with MSLT - differential includes narcolepsy & idiopathic hypersomnolence Encourage regular exercise and weight loss efforts Caution against driving when sleepy & against medications with sedative side effects. This study was personally reviewed and electronically signed by: Dr. Harden Staff Accredited Board Certified in Sleep Medicine Date/Time:    Administration History     None           No data to display          No results found for: NITRICOXIDE      Assessment & Plan:   Narcolepsy without cataplexy Recent MSLT with 2/5 SOREMs, diagnostic for narcolepsy. Improved daytime symptoms with stimulant regimen. Persistent night time sleep disruptions. Reviewed pharmacological  therapy options. Will trial her on Xywav. Side effect/safety profile reviewed. Enrollment paperwork completed. Sleep hygiene reviewed. Safe driving practices reviewed.   Patient Instructions  Your sleep study was negative for sleep apnea.  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 Recommend scheduling naps, as necessary for daytime sleepiness. You can continue the Vyvanse, as prescribed for ADD, to help combat the daytime drowsiness as well   We reviewed potential treatment options for nighttime sleep  Recommended trial of Xywav to help with nighttime sleep disruptions caused by narcolepsy. Do not take this with other sedating medications. You will take the first dose after you get in the bed, with plans to fall asleep within 5-15 minutes. You will set a timer and take the second dose within 2.5-4 hours (depending on when your wake time the next morning needs to be as you'll want another 6 hours in the bed).  Ensure you have 7-8 hours in the bed after taking. Monitor for any mood changes or changes in sleep habits. Stop and notify immediately if these occur. Do not drive or operate heavy machinery after taking. Do not take with alcohol or other sedating medications. May cause some morning grogginess or vivid dreams.   Complete the patient enrollment form I sent you on MyChart Call us  if you haven't heard anything about getting started on this in the next two weeks. It is a specialty medication so we have to complete paperwork and wait on insurance.   Follow up in January with Dr. Neda. If symptoms do not improve or worsen, please contact office for sooner follow up or seek emergency care.    Polycythemia No OSA. Advised to f/u with hematology as scheduled    I discussed the assessment and treatment plan with the patient. The patient was provided an opportunity to ask questions and all were answered. The patient agreed with the plan and demonstrated an understanding of the  instructions.   The patient was advised to call back or seek an in-person evaluation if the symptoms worsen or if the condition fails to improve as anticipated.  I provided 31 minutes of non-face-to-face time during this encounter.  [name] LULLA Rouleau, NP 08/25/2024  Pt aware and understands NP's role.

## 2024-08-26 ENCOUNTER — Telehealth (HOSPITAL_BASED_OUTPATIENT_CLINIC_OR_DEPARTMENT_OTHER): Payer: Self-pay

## 2024-08-26 NOTE — Telephone Encounter (Signed)
 Copied from CRM [id number]. Topic: Clinical - Prescription Issue >> Aug 26, 2024  5:01 PM Pamela Marsh wrote: Reason for CRM: Pamela Marsh with [facility] is calling, have not received prescription or enrollment form for Pamela Marsh Xywav. She also stated that [name] is not yet in the system, she said they need a prescriber cerification form, Xywav rems prescription form and a patient enrollment form. Confirmed fax # and she will fax the needed forms.  CB #: [phone] Option 3 then Option 2.

## 2024-08-27 NOTE — Telephone Encounter (Signed)
 Per chart review, aptient completed her enrollment online on 08/25/2024. Will need prescriber forms completed. This is a controlled medication. Pharmacy cannot sign forms for provider. Will need to be managed under Dr. Jude due to Izetta Cobb's departure  Routing to [place], [place] to have completed by Dr. Jude at [facility]

## 2024-08-29 NOTE — Telephone Encounter (Signed)
 Form filled out placed in Dr Roxan box for when he returns next week

## 2024-08-31 ENCOUNTER — Other Ambulatory Visit (HOSPITAL_COMMUNITY): Payer: Self-pay

## 2024-08-31 ENCOUNTER — Telehealth: Payer: Self-pay

## 2024-08-31 ENCOUNTER — Telehealth: Payer: Self-pay | Admitting: *Deleted

## 2024-08-31 NOTE — Telephone Encounter (Signed)
 Received a fax regarding Prior Authorization from [employer] for XYWAV. Authorization has been DENIED because:   **Pt meets criteria for Narcolepsy diagnosis, confirmed through MSLT but forgot to attach this note to pa. Will need justification as to why she cannot take modafinil, armodafinil or sunosi to appeal**  Phone# [phone]

## 2024-08-31 NOTE — Telephone Encounter (Signed)
 Received fax from [facility] stating a pa has been started for [facility]. Pt has submitted her portion of enrollment form online and we are currently awaiting md's portion to be signed and sent to pharmacy team.   Submitted a Prior Authorization request to [employer] for XYWAV via CoverMyMeds. Will update once we receive a response.  Key: [id number]

## 2024-08-31 NOTE — Telephone Encounter (Signed)
 Copied from CRM #[id number]. Topic: Clinical - Prescription Issue >> Aug 31, 2024 12:06 PM Corean SAUNDERS wrote: Reason for CRM: Camie with [facility] states they received an update on patients XYWAV prior authorization and states it has been denied. Camie is requesting a call back to confirm if an appeal will or will not be submitted.  Please call [phone]  See other phone note dated today per pharm team.

## 2024-09-06 NOTE — Telephone Encounter (Signed)
 Patient is calling to find out if her REMS form has been completed so patient can be enrolled according to [facility].  She will need to come to the office to sign in order to give to the pharmacy. This needs to happen so she can get the [facility] prescription.  Please call the patient to let her know the status of all she needs to do at [phone].  Patient says Pamela Marsh will know what she is talking about.

## 2024-09-07 NOTE — Telephone Encounter (Signed)
 Waiting on signature from katie and will inform patient to sign once completed

## 2024-09-08 ENCOUNTER — Telehealth: Payer: Self-pay | Admitting: *Deleted

## 2024-09-08 NOTE — Telephone Encounter (Signed)
 It doesn't sound like they have me in their system. I thought I had enrolled previously. I can either complete the forms, or Dr. Alva can complete if he is okay with this. He will be the one to follow up with her. Just let me know. Thanks

## 2024-09-08 NOTE — Telephone Encounter (Signed)
 Sending back to you.

## 2024-09-08 NOTE — Telephone Encounter (Signed)
 Please advise

## 2024-09-08 NOTE — Telephone Encounter (Signed)
 Pamela Marsh can you fill this out for pt, Are you enrolled in the REMS? If not Dr Jude can do it he was asking since she is your pt if you can fill out

## 2024-09-08 NOTE — Telephone Encounter (Signed)
 Copied from CRM #[id number]. Topic: Clinical - Prescription Issue >> Sep 05, 2024  1:44 PM Rilla B wrote: Reason for CRM: Laury with [facility] need clarification on the indication of use for XYYWAY.  Please call   @ [phone]. Option 3 then 4 to speak with pharmacist directly.  Okay to leave VM if needed. >> Sep 07, 2024  9:32 AM Russell PARAS wrote: Larnell, with [facility], is contacting clinic regarding pt's XYYWAV. He is needing clarification of the indication of use for this med. Contacted [facility], spoke with Almeda who sent triage message. No response from triage.   Joe requested call back   CB#  [phone], opt 3 and then opt 4  Called and spoke with [place].  She asked about the dx for xyywav- I provided this to her: narcolepsy without cataplexy. Nothing further needed.

## 2024-09-09 ENCOUNTER — Telehealth: Payer: Self-pay

## 2024-09-09 NOTE — Telephone Encounter (Signed)
 Waiting on response from Katie I have Lumryz paperwork done except for pt sig waiting on response before I call pt

## 2024-09-09 NOTE — Telephone Encounter (Signed)
 Per 08/26/24 telephone thread and Dr. Cyndi message on 09/08/24, prefer to use Lumryz instead of Xywav due to ease of dosing. Paperwork for [employer] signed by Dr. Jude.   Will await receipt of paperwork to initiate benefits investigation for Lumryz in separate thread.

## 2024-09-09 NOTE — Telephone Encounter (Signed)
 Per Dr. Cyndi message, we will be using Lumryz. Based on this information, pharmacy team will not submit appeal for insurance denial of Xywav.

## 2024-09-09 NOTE — Telephone Encounter (Signed)
 Please see comment on 08/26/24 telephone thread - patient will be following up with Dr. Jude. Per Dr. Cyndi message on 09/08/24, prefer Lumryz over Crozier due to ease of dosing. Dr. Jude has signed paperwork for Lumryz.   Pharmacy team awaiting paperwork for Lumryz to initiate benefits investigation.

## 2024-09-09 NOTE — Telephone Encounter (Signed)
 See comment on 08/26/24 phone thread - per Dr. Jude who will be following up with patient, prefer Lumryz over Xywav due to ease of dosing.   Dr. Jude signed paperwork for Lumryz.   Opening benefits investigation in this thread.

## 2024-09-09 NOTE — Telephone Encounter (Signed)
 Yes, side effect profile was reviewed with pt. I believe we reviewed pregnancy category but please remind pt she should use effective birth control and not become pregnant while taking. Thanks.

## 2024-09-12 ENCOUNTER — Encounter: Payer: Self-pay | Admitting: Nurse Practitioner

## 2024-09-12 ENCOUNTER — Telehealth: Payer: Self-pay | Admitting: *Deleted

## 2024-09-12 ENCOUNTER — Other Ambulatory Visit (HOSPITAL_COMMUNITY): Payer: Self-pay

## 2024-09-12 NOTE — Telephone Encounter (Signed)
 Dr. Alva needs to be enrolled in [place].  Copied from CRM [id number]. Topic: Clinical - Prescription Issue >> Sep 09, 2024  1:04 PM Devaughn RAMAN wrote: Reason for CRM: Tully with [facility] called for f/u regarding medication enrollment for the pt and provider. [place] stated pt is currently seeing [name] Rouleau and Newt will be taking over to enroll for the pt however he is not enrolled in XYYWAY. Dr.Alva will need to enroll as they need a pt enrollment form and prescriber enrollment form for the pt and On Tuesday the case will be closed if no response and to please f/u as no call has been made.  [facility]  Phone-[phone] opt 3 and then opt 2

## 2024-09-12 NOTE — Telephone Encounter (Signed)
 Will not be moving forward with Xywav, forms not needed.

## 2024-09-12 NOTE — Telephone Encounter (Signed)
 Submitted a Prior Authorization request to [employer] for LUMRYZ via CoverMyMeds. Will update once we receive a response.  Key: [id number]

## 2024-09-14 NOTE — Telephone Encounter (Signed)
 Received a fax regarding Prior Authorization from [employer] for LUMRYZ. Authorization has been DENIED because:   Phone# [phone]

## 2024-09-15 NOTE — Telephone Encounter (Signed)
 I called and left patient a message that I have printed the form and will get to [facility] for signature and to return our call.

## 2024-09-15 NOTE — Telephone Encounter (Signed)
 Dr. Alva, Katie asked that I route this to you.  I have asked Jamie to print the form needed for completion and your signature.  Please advise regarding further information needed regarding denial.  Thank you.

## 2024-09-15 NOTE — Telephone Encounter (Signed)
 Per 08/26/2024 telephone encounter Dr Jude wanted pt to do Lumryz not Crystal, I faxed forms to pharmacy team on 09/13/2024 @ [phone]

## 2024-09-15 NOTE — Telephone Encounter (Signed)
 Copied from CRM [id number]. Topic: Clinical - Prescription Issue >> Sep 09, 2024  1:04 PM Devaughn RAMAN wrote: Reason for CRM: Tully with [facility] called for f/u regarding medication enrollment for the pt and provider. [place] stated pt is currently seeing [name] Rouleau and Newt will be taking over to enroll for the pt however he is not enrolled in XYYWAY. Dr.Alva will need to enroll as they need a pt enrollment form and prescriber enrollment form for the pt and On Tuesday the case will be closed if no response and to please f/u as no call has been made.  [facility]  [phone] opt 3 and then opt 2 >> Sep 13, 2024  1:44 PM Russell PARAS wrote: Signe Search, Patient Advocate with [employer], is contacting office concerning the prescribed Xywav. He reports if the pt wants to proceed, the prescribing provider will need to be enrolled in REMS and advises Dr Jude is already enrolled. The pt will also need to complete the REMS enrollment as well. In addition, provider will need to send a new prescription due to previous one missing information.   Requested call back if pt chooses to move forward with Xywav  CB#  [phone]  Email: [email or web address]

## 2024-09-15 NOTE — Telephone Encounter (Signed)
 She is on Vyvanse so was avoiding other stimulants. There's no interaction between Sunosi and Vyvanse; however, would need to closely monitor BP/HR. The Vyvanse has been helping with her daytime sleepiness symptoms but she's still having disrupted nighttime sleep, which was why I was wanting to do something to help focus on that.

## 2024-09-15 NOTE — Telephone Encounter (Signed)
 Copied from CRM [id number]. Topic: Clinical - Prescription Issue >> Sep 09, 2024  1:04 PM Devaughn RAMAN wrote: Reason for CRM: [facility] with [facility] called for f/u regarding medication enrollment for the pt and provider. [place] stated pt is currently seeing [name] Rouleau and [facility] will be taking over to enroll for the pt however he is not enrolled in XYYWAY. Dr.Alva will need to enroll as they need a pt enrollment form and prescriber enrollment form for the pt and On Tuesday the case will be closed if no response and to please f/u as no call has been made.  [facility]  [phone] opt 3 and then opt 2 >> Sep 13, 2024  1:44 PM Russell PARAS wrote: Signe Search, Patient Advocate with [employer], is contacting office concerning the prescribed Xywav. He reports if the pt wants to proceed, the prescribing provider will need to be enrolled in [facility] and advises Dr Jude is already enrolled. The pt will also need to complete the REMS enrollment as well. In addition, provider will need to send a new prescription due to previous one missing information.   Requested call back if pt chooses to move forward with Xywav  CB#  [phone]  Email: [email or web address]   ---------------------------------------------------------------------------------------------------------------------------------------------  I left a VM for patient letting her know we would complete the form and have the provider sign it.  I also routed the message from pharmacy to the provider where insurance denied the medication d/t needing more information.

## 2024-09-15 NOTE — Telephone Encounter (Signed)
 Katie,  Please see message regarding denial.  I have printed off form patient requested for the XYWAV (enrollment for and assistance card).  I will complete our portion and get it to you for your signature.  Please advise regarding denial.  Thank you.

## 2024-09-15 NOTE — Telephone Encounter (Signed)
 Form has been filled out for Pamela Marsh and sent to [place] on 09/13/2024 for her to get pt sig

## 2024-09-20 NOTE — Telephone Encounter (Signed)
 Faxed URGENT appeal to [facility] for LUMRYZ.   Fax # [phone]  Will await response.

## 2024-09-22 ENCOUNTER — Telehealth: Payer: Self-pay

## 2024-09-22 NOTE — Telephone Encounter (Signed)
 Please advise

## 2024-09-22 NOTE — Telephone Encounter (Signed)
 Please see telephone encounter from 12/16. Pharmacy faxed an urgent appeal.

## 2024-10-05 NOTE — Telephone Encounter (Signed)
 Called [facility] for update on appeal status. Per rep their turnaround time is 14 days, so a decision should have been reached by 12/30. Per rep he was unable to see that there has been an update. He stated he would send a request to the appeals team to call me back with an update. Will await callback.

## 2024-10-18 NOTE — Telephone Encounter (Signed)
 Had called [employer] for update on appeal on 1/6, rep was unable to locate the active case that was referenced on 12/31. Unclear why he was not able to see the call/notes that were made on 12/31. He suggested I refax the appeal. Resent the appeal to [facility], confirmed we used the correct fax number the first time.  Called [employer] on 1/13 for update on appeal. Rep stated the decision is still pending and should have a determination by 1/21. Will reach out again to check status.  Phone #: [phone] Case ID: [id number]   Completed Lumryz enrollment and REMS forms for pt to receive free voucher. Faxed to corresponding fax numbers, will message pt so she is aware they may reach out to ship voucher.  [employer] phone #: [phone] [employer] fax #: [phone] [facility]S fax #: [phone]

## 2024-11-02 ENCOUNTER — Telehealth: Payer: Self-pay

## 2024-11-02 ENCOUNTER — Telehealth (HOSPITAL_BASED_OUTPATIENT_CLINIC_OR_DEPARTMENT_OTHER): Payer: Self-pay

## 2024-11-02 NOTE — Telephone Encounter (Unsigned)
 Copied from CRM #[id number]. Topic: Clinical - Prescription Issue >> Oct 27, 2024  1:41 PM Pamela Marsh wrote: Reason for CRM: Received call from [facility] with [facility] - she was following up on appeal for [facility], I advised that appeal was resubmitted around 10/11/24 and we were given Marsh decision time of 10/26/24 but also Marsh 14 day turnaround time since the appeal was expedited - she mentioned she had called the insurance company and they told her they didn't have anything on file however I did not get clarification on when she had reached out to them

## 2024-11-02 NOTE — Telephone Encounter (Signed)
 Copied from CRM #[id number]. Topic: Clinical - Prescription Issue >> Oct 27, 2024  1:41 PM Ismael A wrote: Reason for CRM: Received call from [facility] with [facility] - she was following up on appeal for [facility], I advised that appeal was resubmitted around 10/11/24 and we were given a decision time of 10/26/24 but also a 14 day turnaround time since the appeal was expedited - she mentioned she had called the insurance company and they told her they didn't have anything on file however I did not get clarification on when she had reached out to them   Pharmacy team,  please advise for appeal for Xyway.

## 2024-11-10 ENCOUNTER — Telehealth: Payer: Self-pay

## 2024-11-10 NOTE — Telephone Encounter (Signed)
 Copied from CRM [id number]. Topic: Clinical - Prescription Issue >> Nov 10, 2024 10:46 AM Russell PARAS wrote: Reason for CRM:   [facility], with [employer], is contacting clinic to speak with either Chasity or Hunter.  Requested call back  CB#  [phone]

## 2024-11-10 NOTE — Telephone Encounter (Signed)
 Per telephone encounter on 2/5 we received a call from [facility] with Lumryz. Returned [employer] and he advised me pt has been approved for [employer]. He stated we must send an electronic rx and fax it as well to [facility]. Prescribing info was faxed to us  and has been sent to media tab.  He stated once we send rx we can advise pt to call to expedite her receiving med. He said once she is started with [employer] we can apply for patient assistance. Pt is pretty responsive and well versed  with technology so he suggested she can complete PAP application online by calling [employer] and requesting they send her a link to the application. Pharmacy team will fax pa denial and appeal denial as supporting docs.  [facility] phone #: [phone]  [employer] phone # (have pt call): [phone]

## 2024-11-10 NOTE — Telephone Encounter (Signed)
 This has been addressed in specialty biv encounter on 12/5

## 2024-11-10 NOTE — Telephone Encounter (Signed)
 Since this is a controlled substance, routing request for Rx to Dr. Jude -   If appropriate, please route Rx for Lumryz to [facility] electronically. PharmD unable to route electronically due to controlled substance.

## 2024-11-11 NOTE — Telephone Encounter (Signed)
 There is nothing to print for us  you need to send an erx for med to [facility] I have added to pts pharmacy section just have to choose that one,  once this is written I can fax to them @ [phone] then I will notify to do the PAP application online

## 2024-11-24 ENCOUNTER — Encounter (HOSPITAL_BASED_OUTPATIENT_CLINIC_OR_DEPARTMENT_OTHER): Admitting: Pulmonary Disease
